# Patient Record
Sex: Female | Born: 1935 | Race: White | Hispanic: No | Marital: Single | Smoking: Never smoker
Health system: Southern US, Community
[De-identification: ages and names within clinical notes are randomized; demographics above are authoritative.]

## PROBLEM LIST (undated history)

## (undated) DIAGNOSIS — E0590 Thyrotoxicosis, unspecified without thyrotoxic crisis or storm: Secondary | ICD-10-CM

## (undated) DIAGNOSIS — F0390 Unspecified dementia without behavioral disturbance: Secondary | ICD-10-CM

## (undated) DIAGNOSIS — K219 Gastro-esophageal reflux disease without esophagitis: Secondary | ICD-10-CM

## (undated) DIAGNOSIS — E079 Disorder of thyroid, unspecified: Secondary | ICD-10-CM

## (undated) HISTORY — PX: ABDOMINAL HYSTERECTOMY: SHX81

---

## 1998-07-07 ENCOUNTER — Other Ambulatory Visit: Admission: RE | Admit: 1998-07-07 | Discharge: 1998-07-07 | Payer: Self-pay | Admitting: Obstetrics and Gynecology

## 1999-07-13 ENCOUNTER — Other Ambulatory Visit: Admission: RE | Admit: 1999-07-13 | Discharge: 1999-07-13 | Payer: Self-pay | Admitting: Obstetrics and Gynecology

## 1999-10-23 ENCOUNTER — Ambulatory Visit (HOSPITAL_COMMUNITY): Admission: RE | Admit: 1999-10-23 | Discharge: 1999-10-23 | Payer: Self-pay

## 1999-10-31 ENCOUNTER — Ambulatory Visit (HOSPITAL_COMMUNITY): Admission: RE | Admit: 1999-10-31 | Discharge: 1999-10-31 | Payer: Self-pay

## 1999-11-07 ENCOUNTER — Ambulatory Visit (HOSPITAL_COMMUNITY): Admission: RE | Admit: 1999-11-07 | Discharge: 1999-11-07 | Payer: Self-pay

## 1999-11-16 ENCOUNTER — Ambulatory Visit (HOSPITAL_COMMUNITY): Admission: RE | Admit: 1999-11-16 | Discharge: 1999-11-16 | Payer: Self-pay

## 2000-02-02 ENCOUNTER — Ambulatory Visit (HOSPITAL_COMMUNITY): Admission: RE | Admit: 2000-02-02 | Discharge: 2000-02-02 | Payer: Self-pay

## 2000-07-18 ENCOUNTER — Other Ambulatory Visit: Admission: RE | Admit: 2000-07-18 | Discharge: 2000-07-18 | Payer: Self-pay | Admitting: Obstetrics and Gynecology

## 2001-07-21 ENCOUNTER — Other Ambulatory Visit: Admission: RE | Admit: 2001-07-21 | Discharge: 2001-07-21 | Payer: Self-pay | Admitting: Obstetrics and Gynecology

## 2005-04-02 ENCOUNTER — Ambulatory Visit: Payer: Self-pay | Admitting: Internal Medicine

## 2005-04-13 ENCOUNTER — Ambulatory Visit: Payer: Self-pay | Admitting: Internal Medicine

## 2005-08-29 ENCOUNTER — Ambulatory Visit: Payer: Self-pay

## 2006-12-14 ENCOUNTER — Emergency Department: Payer: Self-pay | Admitting: Emergency Medicine

## 2008-11-08 ENCOUNTER — Encounter: Payer: Self-pay | Admitting: Family Medicine

## 2008-11-11 ENCOUNTER — Ambulatory Visit: Payer: Self-pay | Admitting: Family Medicine

## 2008-11-11 DIAGNOSIS — R6883 Chills (without fever): Secondary | ICD-10-CM

## 2008-11-11 DIAGNOSIS — K5900 Constipation, unspecified: Secondary | ICD-10-CM | POA: Insufficient documentation

## 2008-11-11 DIAGNOSIS — E039 Hypothyroidism, unspecified: Secondary | ICD-10-CM | POA: Insufficient documentation

## 2008-11-11 DIAGNOSIS — M25559 Pain in unspecified hip: Secondary | ICD-10-CM

## 2008-11-11 LAB — CONVERTED CEMR LAB
HCT: 42.6 % (ref 36.0–46.0)
Hemoglobin: 13.9 g/dL (ref 12.0–15.0)
MCHC: 32.6 g/dL (ref 30.0–36.0)
MCV: 100 fL (ref 78.0–100.0)
Platelets: 272 10*3/uL (ref 150–400)
RBC: 4.26 M/uL (ref 3.87–5.11)
RDW: 14.4 % (ref 11.5–15.5)
TSH: 4.148 microintl units/mL (ref 0.350–4.500)
WBC: 6.9 10*3/uL (ref 4.0–10.5)

## 2008-11-14 ENCOUNTER — Encounter: Payer: Self-pay | Admitting: Family Medicine

## 2008-12-03 ENCOUNTER — Telehealth: Payer: Self-pay | Admitting: Family Medicine

## 2008-12-09 ENCOUNTER — Ambulatory Visit: Payer: Self-pay | Admitting: Family Medicine

## 2008-12-09 DIAGNOSIS — F4321 Adjustment disorder with depressed mood: Secondary | ICD-10-CM

## 2010-04-12 ENCOUNTER — Observation Stay: Payer: Self-pay | Admitting: *Deleted

## 2010-10-03 ENCOUNTER — Encounter: Payer: Self-pay | Admitting: Home Health Services

## 2010-10-20 ENCOUNTER — Encounter: Payer: Self-pay | Admitting: Family Medicine

## 2010-10-20 ENCOUNTER — Ambulatory Visit (INDEPENDENT_AMBULATORY_CARE_PROVIDER_SITE_OTHER): Payer: Medicare Other | Admitting: Family Medicine

## 2010-10-20 DIAGNOSIS — M25559 Pain in unspecified hip: Secondary | ICD-10-CM

## 2010-10-20 DIAGNOSIS — F32A Depression, unspecified: Secondary | ICD-10-CM | POA: Insufficient documentation

## 2010-10-20 DIAGNOSIS — K5900 Constipation, unspecified: Secondary | ICD-10-CM

## 2010-10-20 DIAGNOSIS — F329 Major depressive disorder, single episode, unspecified: Secondary | ICD-10-CM

## 2010-10-20 DIAGNOSIS — E039 Hypothyroidism, unspecified: Secondary | ICD-10-CM

## 2010-10-20 LAB — CBC WITH DIFFERENTIAL/PLATELET
Basophils Relative: 0.8 % (ref 0.0–3.0)
Eosinophils Relative: 3.7 % (ref 0.0–5.0)
Hemoglobin: 13.1 g/dL (ref 12.0–15.0)
Lymphocytes Relative: 27 % (ref 12.0–46.0)
MCHC: 33.8 g/dL (ref 30.0–36.0)
Monocytes Relative: 10 % (ref 3.0–12.0)
Neutro Abs: 4.8 10*3/uL (ref 1.4–7.7)
Neutrophils Relative %: 58.5 % (ref 43.0–77.0)
RBC: 3.86 Mil/uL — ABNORMAL LOW (ref 3.87–5.11)
WBC: 8.1 10*3/uL (ref 4.5–10.5)

## 2010-10-20 LAB — FOLATE: Folate: >24.8 ng/mL (ref >5.9)

## 2010-10-20 LAB — TSH: TSH: 3.64 u[IU]/mL (ref 0.35–5.50)

## 2010-10-20 MED ORDER — CITALOPRAM HYDROBROMIDE 10 MG PO TABS: 10.0000 mg | ORAL_TABLET | Freq: Every day | ORAL | Status: DC

## 2010-10-20 MED ORDER — ESTROGENS CONJUGATED 1.25 MG PO TABS: 1.2500 mg | ORAL_TABLET | Freq: Every day | ORAL | Status: DC

## 2010-10-20 MED ORDER — LEVOTHYROXINE SODIUM 50 MCG PO TABS: 50.0000 ug | ORAL_TABLET | Freq: Every day | ORAL | Status: DC

## 2010-10-20 NOTE — Progress Notes (Signed)
Addended by: Adriana Simas on: 10/20/2010 12:02 PM   Modules accepted: Orders

## 2010-10-20 NOTE — Progress Notes (Signed)
  Subjective:    Patient ID: Kristina Browning, female    DOB: [birth year], 77 y.o.   MRN: [id number]  HPI 77  yo female here to establish care. She is here with her two sons.  Depression- lost her husband 5 years ago.  According to pt and her sons, she had an appropriate grief reaction initially.  She was still working 3 days a week which really helped to cheer her up. In October, she stopped working.  Since then, more tearful and more forget.  No difficulty sleeping or decreased appetite.  No SI or HI.  She lives alone and is completely independent for all ADLs and AIDLs, including balancing her checkbook.  Past few weeks, sons have noticed that she is also making more mistakes balancing her checkbook.   Hypothyroidism- has noticed some increased constipation.  Denies other symptoms of hypo or hyperthyroidism. Has been on Synthroid 50 mcg for years.      Has been on premarin for 40 years.  Feels good taking it and would like to continue.   The PMH, PSH, Social History, Family History, Medications, and allergies have been reviewed in [facility], and have been updated if relevant.   Review of Systems    see HPI General: Denies fever, chills, sweats. No significant weight loss. Eyes: Denies blurring,significant itching ENT: Denies earache, sore throat, and hoarseness.  Cardiovascular: Denies chest pains, palpitations, dyspnea on exertion,  Respiratory: Denies cough, dyspnea at rest,wheeezing Breast: no concerns about lumps GI: Denies nausea, vomiting, diarrhea, , abdominal pain, melena, hematochezia. Endorses constipation. GU: Denies dysuria, hematuria, urinary hesitancy, nocturia, denies STD risk, no concerns about discharge Musculoskeletal: Denies back pain, joint pain Derm: Denies rash, itching Neuro: Denies  paresthesias, frequent falls, frequent headaches Psych: +tearfulness, denies anxiety. Endocrine: Denies cold intolerance, heat intolerance, polydipsia Heme: Denies enlarged lymph  nodes Allergy: No hayfever  Objective:   Physical Exam BP 120/70  Pulse 64  Temp(Src) 98.7 F (37.1 C) (Oral)  Ht 5\' 8"  (1.727 m)  Wt 132 lb 1.9 oz (59.929 kg)  BMI 20.09 kg/m2  General:  Appears younger than stated age.  No distress.   Lungs:  Normal respiratory effort, chest expands symmetrically. Lungs are clear to auscultation, no crackles or wheezes. Heart:  Normal rate and regular rhythm. S1 and S2 normal without gallop, murmur, click, rub or other extra sounds. Abdomen:  Bowel sounds positive,abdomen soft and non-tender without masses, organomegaly or hernias noted. Msk:  Back without TTP.  Pt indicates that when she does have her pain, it is in the paraspinous region of the lumbar spine.  Hips with FROM B without pain.  No erythema or warmth noted in back or hips. Pulses:  R radial normal.   Psych- pleasant, good eye contact, oriented x 3.   Assessment & Plan:

## 2010-10-20 NOTE — Patient Instructions (Signed)
Great to meet you. Please make an appointment to come see me in one month.

## 2010-10-20 NOTE — Assessment & Plan Note (Signed)
Recheck TSH today.  

## 2010-10-21 LAB — VITAMIN D 25 HYDROXY (VIT D DEFICIENCY, FRACTURES): Vit D, 25-Hydroxy: 65 ng/mL (ref 30–89)

## 2010-11-14 ENCOUNTER — Encounter: Payer: Self-pay | Admitting: Family Medicine

## 2010-11-14 ENCOUNTER — Ambulatory Visit: Payer: PRIVATE HEALTH INSURANCE | Admitting: Family Medicine

## 2010-11-14 ENCOUNTER — Ambulatory Visit (INDEPENDENT_AMBULATORY_CARE_PROVIDER_SITE_OTHER): Payer: Medicare Other | Admitting: Family Medicine

## 2010-11-14 VITALS — BP 120/70 | HR 66 | Temp 98.0°F | Ht 68.0 in | Wt 136.8 lb

## 2010-11-14 DIAGNOSIS — F329 Major depressive disorder, single episode, unspecified: Secondary | ICD-10-CM

## 2010-11-14 MED ORDER — CITALOPRAM HYDROBROMIDE 10 MG PO TABS: 10.0000 mg | ORAL_TABLET | Freq: Every day | ORAL | Status: DC

## 2010-11-14 NOTE — Progress Notes (Signed)
  Subjective:    Patient ID: Kristina Browning, female    DOB: [birth year], 77 y.o.   MRN: [id number]  HPI 77  yo female here for follow up memory/depression. She is here with her two sons.  Depression- lost her husband 5 years ago.  According to pt and her sons, she had an appropriate grief reaction initially.  She was still working 3 days a week which really helped to cheer her up.   No longer working. Started Celexa 10 mg daily last month.  Per pt, much less tearful, much happier.  Sons still concerned about her memory although admit the things she forgets do not seem major in nature. For example, she told one son that she didn't do anything yesterday and told the other one that she went to the post office.    No difficulty sleeping or decreased appetite.  No SI or HI.  TSH, CBC, Vit D, B12 were within normal limits.  She lives alone and is completely independent for all ADLs and AIDLs, including balancing her checkbook.    The PMH, PSH, Social History, Family History, Medications, and allergies have been reviewed in [facility], and have been updated if relevant.   Review of Systems    see HPI General: Denies fever, chills, sweats. No significant weight loss. Cardiovascular: Denies chest pains, palpitations, dyspnea on exertion,  Respiratory: Denies cough, dyspnea at rest,wheeezing Psych: denies tearfulness, denies anxiety. Endocrine: Denies cold intolerance, heat intolerance, polydipsia Heme: Denies enlarged lymph nodes   Objective:   Physical Exam BP 120/70  Pulse 66  Temp(Src) 98 F (36.7 C) (Oral)  Ht 5\' 8"  (1.727 m)  Wt 136 lb 12.8 oz (62.052 kg)  BMI 20.80 kg/m2  General:  Appears younger than stated age.  No distress.   Lungs:  Normal respiratory effort, chest expands symmetrically. Lungs are clear to auscultation, no crackles or wheezes. Heart:  Normal rate and regular rhythm. S1 and S2 normal without gallop, murmur, click, rub or other extra sounds. Abdomen:  Bowel  sounds positive,abdomen soft and non-tender without masses, organomegaly or hernias noted. Msk:  Back without TTP.  Pt indicates that when she does have her pain, it is in the paraspinous region of the lumbar spine.  Hips with FROM B without pain.  No erythema or warmth noted in back or hips. Pulses:  R radial normal.   Psych- pleasant, good eye contact, oriented x 3.  Mental Status Exam max value patient value  Orientation to time 5 5  Orientation to place 5 5  Registration 3 3  Attention 5 5  Recall 3 3  Language (name 2 objects) 2 2  Language-repeat 1 1  Language-follow 3 step command 3 3  Language-read and follow directions 1 1  Draw a pentagon  1  Write a sentence  1   Mental Status Exam: 28/28     Assessment & Plan:

## 2010-12-05 ENCOUNTER — Ambulatory Visit: Payer: PRIVATE HEALTH INSURANCE | Admitting: Family Medicine

## 2010-12-13 ENCOUNTER — Telehealth: Payer: Self-pay | Admitting: *Deleted

## 2010-12-13 NOTE — Telephone Encounter (Signed)
I do not think it is due to the citalopram but any side effect is possible with any medication. Let's try to wean off of it.  One tablet every other day and then stop.  Let's reassess her memory after she has stopped taking it.

## 2010-12-13 NOTE — Telephone Encounter (Signed)
Patients son and patient notified as instructed via telephone.  He will call and schedule appt once the taper is complete.

## 2010-12-13 NOTE — Telephone Encounter (Signed)
Son says that since patient started the citalopram her short term memory has gotten worse. She can't remember things that she just did. Son is asking if you think it could be related to the citalopram. If so patient is asking if she could come off of it and see it it improves without it. Please advise.

## 2010-12-22 ENCOUNTER — Telehealth: Payer: Self-pay | Admitting: *Deleted

## 2010-12-22 DIAGNOSIS — F329 Major depressive disorder, single episode, unspecified: Secondary | ICD-10-CM

## 2010-12-22 DIAGNOSIS — F4321 Adjustment disorder with depressed mood: Secondary | ICD-10-CM

## 2010-12-22 NOTE — Telephone Encounter (Signed)
Ok I will also make a referral to a psychiatrist as we may need their input as well if that is ok with her son.

## 2010-12-22 NOTE — Telephone Encounter (Signed)
Pt's son states pt has had problems with short term memory for the last 2 weeks.  They thought this could have been caused by her taking celexa, but she has weaned off and the memory problems are worse, along with worsening depression and sadness.  She doesn't remember spending money, shopping, talking with relatives.  Appt made for next week to discuss.

## 2010-12-25 ENCOUNTER — Encounter: Payer: Self-pay | Admitting: Family Medicine

## 2010-12-25 ENCOUNTER — Ambulatory Visit: Payer: Self-pay | Admitting: Family Medicine

## 2010-12-25 ENCOUNTER — Ambulatory Visit (INDEPENDENT_AMBULATORY_CARE_PROVIDER_SITE_OTHER): Payer: Medicare Other | Admitting: Family Medicine

## 2010-12-25 VITALS — BP 130/70 | HR 70 | Temp 97.6°F | Wt 133.8 lb

## 2010-12-25 DIAGNOSIS — R413 Other amnesia: Secondary | ICD-10-CM

## 2010-12-25 DIAGNOSIS — F3289 Other specified depressive episodes: Secondary | ICD-10-CM

## 2010-12-25 DIAGNOSIS — F329 Major depressive disorder, single episode, unspecified: Secondary | ICD-10-CM

## 2010-12-25 NOTE — Progress Notes (Signed)
  Subjective:    Patient ID: Kristina Browning, female    DOB: 1925-10-06, 77 y.o.   MRN: [id number]  HPI 77  yo female here for follow up memory/depression. She is here with her son, [name].  Very supportive family.  Depression- lost her husband 5 years ago.  According to pt and her sons, she had an appropriate grief reaction initially.  She was still working 3 days a week which really helped to cheer her up.   Started Celexa 10 mg daily a few months ago.  Per pt, much less tearful, much happier.  Sons were still concerned about her short term memory, so we attempted to wean off Celexa.  Per Ms. Killingsworth and her son, mood declined without Celexa and she was still forgetting things.  For example, she may forget something she said earlier in same conversation.  She just restarted Celexa 10 mg daily and already feeling better.    No difficulty sleeping or decreased appetite.  No SI or HI.  Memorial day was 5 year anniversary of her husband's death.  MMSE 28/28 in 11/10/2022.  TSH, CBC, Vit D, B12 were within normal limits.  She lives alone and is completely independent for all ADLs and AIDLs, including balancing her checkbook.    She and her sons are ok with pursing a geriatric memory clinic/psychiatrist, preferably at [facility].  The PMH, PSH, Social History, Family History, Medications, and allergies have been reviewed in [facility], and have been updated if relevant.   Review of Systems    see HPI General: Denies fever, chills, sweats. No significant weight loss. Cardiovascular: Denies chest pains, palpitations, dyspnea on exertion,  Respiratory: Denies cough, dyspnea at rest,wheeezing Psych: denies tearfulness, denies anxiety. Endocrine: Denies cold intolerance, heat intolerance, polydipsia Heme: Denies enlarged lymph nodes   Objective:   Physical Exam BP 130/70  Pulse 70  Temp(Src) 97.6 F (36.4 C) (Oral)  Wt 133 lb 12.8 oz (60.691 kg)  General:  Appears younger  than stated age.  No distress.   Lungs:  Normal respiratory effort, chest expands symmetrically. Lungs are clear to auscultation, no crackles or wheezes. Heart:  Normal rate and regular rhythm. S1 and S2 normal without gallop, murmur, click, rub or other extra sounds. Pulses:  R radial normal.   Psych- pleasant, good eye contact, oriented x 3.   Assessment & Plan:  A/p-  Memory loss- >25 min spent with face to face with patient counseling and coordinating care. Pt is ok with referral to geriatric center.  Appt made.  Depression- improved with Celexa.  Continue 10 mg daily dose.

## 2010-12-25 NOTE — Telephone Encounter (Signed)
Patient coming in today to see Dr. Dayton Martes.  Will discuss at here appt.

## 2011-01-17 ENCOUNTER — Other Ambulatory Visit: Payer: Self-pay

## 2011-04-26 ENCOUNTER — Ambulatory Visit (INDEPENDENT_AMBULATORY_CARE_PROVIDER_SITE_OTHER): Payer: Medicare Other | Admitting: Family Medicine

## 2011-04-26 ENCOUNTER — Encounter: Payer: Self-pay | Admitting: Family Medicine

## 2011-04-26 ENCOUNTER — Telehealth: Payer: Self-pay | Admitting: *Deleted

## 2011-04-26 VITALS — BP 130/62 | HR 72 | Temp 98.7°F | Ht 68.0 in | Wt 131.8 lb

## 2011-04-26 DIAGNOSIS — R413 Other amnesia: Secondary | ICD-10-CM

## 2011-04-26 DIAGNOSIS — R197 Diarrhea, unspecified: Secondary | ICD-10-CM | POA: Insufficient documentation

## 2011-04-26 DIAGNOSIS — E039 Hypothyroidism, unspecified: Secondary | ICD-10-CM

## 2011-04-26 NOTE — Patient Instructions (Signed)
Good to see you. Please call Kristina Browning [facility] to make a follow up appointment as we discussed. It's ok to not restart your Premarin.

## 2011-04-26 NOTE — Telephone Encounter (Signed)
Requested medical records from the release of information department from patients visits with the [facility].

## 2011-04-26 NOTE — Progress Notes (Signed)
  Subjective:    Patient ID: Kristina Browning, female    DOB: [birth year], 77 y.o.   MRN: [id number]  HPI 77  yo female here for follow up memory/depression. She is here with her son, [name].    Depression- lost her husband 5 years ago.  According to pt and her sons, she had an appropriate grief reaction initially.  Started Celexa 10 mg daily , eventually increased to 20 mg daily.  Referred to [facility]- we have requested office notes and were faxed last office visit from 03/13/2011- notes reviewed. Started on Aricept 5 mg daily in August and eventually weaned up to Aricept 10 mg daily.  Her son, [name], has recently taken away her driving privileges due to safety concerns.  Since increasing dose of Aricept, she was having severe bouts of diarrhea.  No blood in stool, no abdominal pain.  Stopped aricept and symptoms have completely resolved.  Weight relatively stable, has lost 5 pounds since April. Wt Readings from Last 3 Encounters:  04/26/11 131 lb 12 oz (59.761 kg)  12/25/10 133 lb 12.8 oz (60.691 kg)  11/14/10 136 lb 12.8 oz (62.052 kg)    The PMH, PSH, Social History, Family History, Medications, and allergies have been reviewed in [facility], and have been updated if relevant.   Review of Systems    see HPI General: Denies fever, chills, sweats. No significant weight loss. Cardiovascular: Denies chest pains, palpitations, dyspnea on exertion,  Respiratory: Denies cough, dyspnea at rest,wheeezing Psych: denies tearfulness, denies anxiety. Endocrine: Denies cold intolerance, heat intolerance, polydipsia Heme: Denies enlarged lymph nodes   Objective:   Physical Exam BP 130/62  Pulse 72  Temp(Src) 98.7 F (37.1 C) (Oral)  Ht 5\' 8"  (1.727 m)  Wt 131 lb 12 oz (59.761 kg)  BMI 20.03 kg/m2  General:  Appears younger than stated age.  No distress.   Lungs:  Normal respiratory effort, chest expands symmetrically. Lungs are clear to auscultation, no crackles or  wheezes. Heart:  Normal rate and regular rhythm. S1 and S2 normal without gallop, murmur, click, rub or other extra sounds. Pulses:  R radial normal.   Psych- pleasant, good eye contact, oriented x 3.   Assessment & Plan:   1. Memory loss   Mild but per son's report, severe decline over last several weeks even before stopping the aricept. >25 min spent with face to face with patient, >50% counseling and/or coordinating care. I asked both pt and her son who they would like to manage dementia and depression.  They prefer to continue with [facility] clinic and will call to set up follow appt.    2. Diarrhea   Resolved, likely secondary to higher dose of aricept. No further tx necessary.

## 2011-04-27 LAB — T4, FREE: Free T4: 0.73 ng/dL (ref 0.60–1.60)

## 2011-05-15 ENCOUNTER — Ambulatory Visit: Payer: PRIVATE HEALTH INSURANCE | Admitting: Family Medicine

## 2011-07-19 ENCOUNTER — Other Ambulatory Visit: Payer: Self-pay | Admitting: *Deleted

## 2011-07-19 MED ORDER — LEVOTHYROXINE SODIUM 50 MCG PO TABS: 50.0000 ug | ORAL_TABLET | Freq: Every day | ORAL | Status: DC

## 2011-07-27 ENCOUNTER — Ambulatory Visit (INDEPENDENT_AMBULATORY_CARE_PROVIDER_SITE_OTHER): Payer: Medicare Other | Admitting: Family Medicine

## 2011-07-27 ENCOUNTER — Other Ambulatory Visit: Payer: Self-pay | Admitting: Family Medicine

## 2011-07-27 ENCOUNTER — Ambulatory Visit (INDEPENDENT_AMBULATORY_CARE_PROVIDER_SITE_OTHER)
Admission: RE | Admit: 2011-07-27 | Discharge: 2011-07-27 | Disposition: A | Discharge status: Home / Self Care | Payer: Medicare Other | Source: Ambulatory Visit | Attending: Family Medicine | Admitting: Family Medicine

## 2011-07-27 ENCOUNTER — Encounter: Payer: Self-pay | Admitting: Family Medicine

## 2011-07-27 VITALS — BP 112/70 | HR 87 | Temp 98.4°F | Wt 140.2 lb

## 2011-07-27 DIAGNOSIS — M25519 Pain in unspecified shoulder: Secondary | ICD-10-CM

## 2011-07-27 DIAGNOSIS — E039 Hypothyroidism, unspecified: Secondary | ICD-10-CM

## 2011-07-27 DIAGNOSIS — M25511 Pain in right shoulder: Secondary | ICD-10-CM

## 2011-07-27 DIAGNOSIS — R9389 Abnormal findings on diagnostic imaging of other specified body structures: Secondary | ICD-10-CM | POA: Insufficient documentation

## 2011-07-27 DIAGNOSIS — R413 Other amnesia: Secondary | ICD-10-CM

## 2011-07-27 NOTE — Progress Notes (Signed)
  Subjective:    Patient ID: Kristina Browning, female    DOB: 07/16/1926, 78 y.o.   MRN: [id number]  HPI 78  yo female here for follow up. She is here with her sons.   Depression- sons feel like her symptoms have resolved and pt agrees. Would like to stop taking the citalopram.  Memory is worse.    Referred to [facility]- tried aricept but she could not tolerate it due to GI side effects.   Sons worried that her memory is deteriorating and not safe at home.    Weight relatively stable, has lost 5 pounds since April. Wt Readings from Last 3 Encounters:  04/26/11 131 lb 12 oz (59.761 kg)  12/25/10 133 lb 12.8 oz (60.691 kg)  11/14/10 136 lb 12.8 oz (62.052 kg)   Hypothyroidism- on synthroid 50 mcg daily. Lab Results  Component Value Date   TSH 3.16 04/26/2011  Denies any symptoms of hypo or hyperthyroidism.  Right shoulder pain- fell on right shoulder several months ago.  Still sore.  Sons concerned that she is minimizing pain.    The PMH, PSH, Social History, Family History, Medications, and allergies have been reviewed in [facility], and have been updated if relevant.   Review of Systems    see HPI General: Denies fever, chills, sweats. No significant weight loss. Cardiovascular: Denies chest pains, palpitations, dyspnea on exertion,  Respiratory: Denies cough, dyspnea at rest,wheeezing Psych: denies tearfulness, denies anxiety. Endocrine: Denies cold intolerance, heat intolerance, polydipsia Heme: Denies enlarged lymph nodes   Objective:   Physical Exam BP 112/70  Pulse 87  Temp(Src) 98.4 F (36.9 C) (Oral)  Wt 140 lb 4 oz (63.617 kg)  General:  Appears younger than stated age.  No distress.   Lungs:  Normal respiratory effort, chest expands symmetrically. Lungs are clear to auscultation, no crackles or wheezes. Heart:  Normal rate and regular rhythm. S1 and S2 normal without gallop, murmur, click, rub or other extra sounds. Pulses:  R radial normal.    MSK:   FROM of right shoulder, neg empty can, neg arch Psych- pleasant, good eye contact, oriented x 3.   Assessment & Plan:   1. Unspecified hypothyroidism  Unchanged,  Recheck labs today. TSH, T4, free  2. Right shoulder pain  New- benign exam, no signs of rotator cuff injury.  Due to sons concerns, will check xray today. DG Shoulder Right  3. Memory loss  Per sons, continues to deteriorate.  Discussed moving to ALF.  They will look into it.

## 2011-07-27 NOTE — Patient Instructions (Signed)
Good to see you. I will call you with the results of your xray and blood work.  To wean off citalopram: Take 1 tablet every other day for 1 week, then take 1/2 tablet every other day for a week and stop.  Visit places like [facility] and pick up an [facility] for me to fill out. Let me know how I can help.  Have a good weekend.

## 2011-07-28 LAB — TSH: TSH: 7.736 u[IU]/mL — ABNORMAL HIGH (ref 0.350–4.500)

## 2011-07-28 LAB — T4, FREE: Free T4: 0.79 ng/dL — ABNORMAL LOW (ref 0.80–1.80)

## 2011-07-30 ENCOUNTER — Other Ambulatory Visit: Payer: Self-pay | Admitting: *Deleted

## 2011-07-30 MED ORDER — LEVOTHYROXINE SODIUM 75 MCG PO TABS: 75.0000 ug | ORAL_TABLET | Freq: Every day | ORAL | Status: DC

## 2011-07-30 NOTE — Telephone Encounter (Signed)
Pharmacy faxed over refill request stating that patient has been taking brand name Synthroid and requested a new Rx be sent in.  Rx sent to [facility] pharmacy.

## 2011-08-07 ENCOUNTER — Encounter (HOSPITAL_COMMUNITY): Payer: Self-pay | Admitting: *Deleted

## 2011-08-07 ENCOUNTER — Emergency Department (HOSPITAL_COMMUNITY)
Admission: EM | Admit: 2011-08-07 | Discharge: 2011-08-07 | Disposition: A | Discharge status: Home / Self Care | Payer: Medicare Other | Attending: Emergency Medicine | Admitting: Emergency Medicine

## 2011-08-07 ENCOUNTER — Emergency Department (HOSPITAL_COMMUNITY): Payer: Medicare Other

## 2011-08-07 ENCOUNTER — Ambulatory Visit: Payer: Medicare Other | Admitting: Family Medicine

## 2011-08-07 ENCOUNTER — Other Ambulatory Visit: Payer: Medicare Other

## 2011-08-07 DIAGNOSIS — M25519 Pain in unspecified shoulder: Secondary | ICD-10-CM | POA: Insufficient documentation

## 2011-08-07 DIAGNOSIS — W010XXA Fall on same level from slipping, tripping and stumbling without subsequent striking against object, initial encounter: Secondary | ICD-10-CM | POA: Insufficient documentation

## 2011-08-07 DIAGNOSIS — Z79899 Other long term (current) drug therapy: Secondary | ICD-10-CM | POA: Insufficient documentation

## 2011-08-07 DIAGNOSIS — S060X0A Concussion without loss of consciousness, initial encounter: Secondary | ICD-10-CM | POA: Insufficient documentation

## 2011-08-07 DIAGNOSIS — R51 Headache: Secondary | ICD-10-CM | POA: Insufficient documentation

## 2011-08-07 DIAGNOSIS — F068 Other specified mental disorders due to known physiological condition: Secondary | ICD-10-CM | POA: Insufficient documentation

## 2011-08-07 DIAGNOSIS — S0003XA Contusion of scalp, initial encounter: Secondary | ICD-10-CM | POA: Insufficient documentation

## 2011-08-07 HISTORY — DX: Unspecified dementia, unspecified severity, without behavioral disturbance, psychotic disturbance, mood disturbance, and anxiety: F03.90

## 2011-08-07 HISTORY — DX: Disorder of thyroid, unspecified: E07.9

## 2011-08-07 NOTE — ED Notes (Signed)
Discharge instructions reviewed with pt; verbalizes understanding.  No questions asked; no further c/o's voiced.  Pt ambulatory to lobby with son.

## 2011-08-07 NOTE — ED Provider Notes (Signed)
History     CSN: [id number]  Arrival date & time 08/07/11  9147   First MD Initiated Contact with Patient 08/07/11 1104      Chief Complaint  Patient presents with  . Fall     Patient is a 78 y.o. female presenting with fall. The history is provided by the patient.  Fall The accident occurred 1 to 2 hours ago. She fell from a height of 3 to 5 ft. The point of impact was the head and right shoulder. The pain is present in the head and right shoulder. The pain is mild. She was ambulatory at the scene. Pertinent negatives include no visual change, no abdominal pain, no nausea, no vomiting, no headaches and no loss of consciousness. The symptoms are aggravated by activity.  Pt presents s/p fall She reports she slipped/fell in the bathroom No LOC Reports right shoulder pain No back/chest/neck pain reported No abd pain She is not on anticoagulants She lives at home but with family support Has had some chronic right shoulder pain that is not new  Past Medical History  Diagnosis Date  . Dementia   . Thyroid disease     History reviewed. No pertinent past surgical history.  No family history on file.  History  Substance Use Topics  . Smoking status: Never Smoker   . Smokeless tobacco: Not on file  . Alcohol Use: No    OB History    Grav Para Term Preterm Abortions TAB SAB Ect Mult Living                  Review of Systems  Gastrointestinal: Negative for nausea, vomiting and abdominal pain.  Neurological: Negative for loss of consciousness and headaches.    Allergies  Sulfonamide derivatives  Home Medications   Current Outpatient Rx  Name Route Sig Dispense Refill  . CITALOPRAM HYDROBROMIDE 10 MG PO TABS Oral Take 5 mg by mouth every other day.    Marland Kitchen LEVOTHYROXINE SODIUM 75 MCG PO TABS Oral Take 1 tablet (75 mcg total) by mouth daily. 30 tablet 1    Dispense as written.  . CENTRUM SILVER PO Oral Take 1 tablet by mouth daily.        BP 125/62  Pulse 75  Temp  98.3 F (36.8 C)  Resp 20  SpO2 99%  Physical Exam  CONSTITUTIONAL: Well developed/well nourished HEAD AND FACE: small hematoma noted to occiput No laceration noted No stepoffs noted EYES: EOMI/PERRL ENMT: Mucous membranes moist No evidence of facial/nasal trauma NECK: supple no meningeal signs SPINE:entire spine nontender No bruising/crepitance/stepoffs noted to spine CV: S1/S2 noted, no murmurs/rubs/gallops noted LUNGS: Lungs are clear to auscultation bilaterally, no apparent distress ABDOMEN: soft, nontender, no rebound or guarding GU:no cva tenderness NEURO: Pt is awake/alert, moves all extremitiesx4 Normal gait noted EXTREMITIES: pulses normal, full ROM Tender to right anterior shoulder but no bruising/crepitance/stepoffs.  She is able to range the shoulder and touch opposite shoulder with right hand All other extremities/joints palpated/ranged and nontender No bony spine tenderness, no bruising to buttocks (chaperone present) SKIN: warm, color normal PSYCH: no abnormalities of mood noted   ED Course  Procedures  12:30 PM She is safe for d/c home Good family support No other signs of traumatic injury     MDM  Nursing notes reviewed and considered in documentation         Joya Gaskins, MD 08/07/11 1230

## 2011-08-07 NOTE — ED Notes (Signed)
Pt has dementia and lives alone and fell this am in the bathroom. Pt hit the back of the tub with the back of her head and has hematoma there.

## 2011-08-22 ENCOUNTER — Other Ambulatory Visit: Payer: Medicare Other

## 2011-08-30 ENCOUNTER — Ambulatory Visit (INDEPENDENT_AMBULATORY_CARE_PROVIDER_SITE_OTHER)
Admission: RE | Admit: 2011-08-30 | Discharge: 2011-08-30 | Disposition: A | Discharge status: Home / Self Care | Payer: Medicare Other | Source: Ambulatory Visit | Attending: Family Medicine | Admitting: Family Medicine

## 2011-08-30 ENCOUNTER — Other Ambulatory Visit: Payer: Self-pay | Admitting: Family Medicine

## 2011-08-30 DIAGNOSIS — R911 Solitary pulmonary nodule: Secondary | ICD-10-CM

## 2011-08-30 DIAGNOSIS — R9389 Abnormal findings on diagnostic imaging of other specified body structures: Secondary | ICD-10-CM

## 2011-09-27 ENCOUNTER — Other Ambulatory Visit: Payer: Medicare Other

## 2011-09-28 ENCOUNTER — Other Ambulatory Visit: Payer: Self-pay

## 2011-09-28 MED ORDER — LEVOTHYROXINE SODIUM 75 MCG PO TABS: 75.0000 ug | ORAL_TABLET | Freq: Every day | ORAL | Status: DC

## 2011-09-28 NOTE — Telephone Encounter (Signed)
pts son came by office to ck on refill of Synthroid 75 mcg. Pts lab appt was cancelled on 09/27/11.Pts son said his brother had to go out of town and he guesses that is why appt cancelled. He will speak with his brother and call back to reschedule lab appt. Dr Dayton Martes said OK to refill Synthroid 75 mcg #30 until lab repeated. Medication phoned to [facility] pharmacy as instructed.

## 2011-10-25 ENCOUNTER — Other Ambulatory Visit: Payer: Self-pay | Admitting: *Deleted

## 2011-10-25 MED ORDER — LEVOTHYROXINE SODIUM 75 MCG PO TABS: 75.0000 ug | ORAL_TABLET | Freq: Every day | ORAL | Status: DC

## 2011-10-31 ENCOUNTER — Other Ambulatory Visit: Payer: Self-pay

## 2011-10-31 NOTE — Telephone Encounter (Signed)
[facility] did not receive the phone in order on 10/25/11 for DAW Synthroid 75 mcg. Spoke with pharmacist at [facility] and gave verbal for # 30 x 11.

## 2012-02-20 ENCOUNTER — Telehealth: Payer: Self-pay

## 2012-02-20 NOTE — Telephone Encounter (Signed)
Please call to check on pt. 

## 2012-02-20 NOTE — Telephone Encounter (Signed)
Kristina Browning with [facility] speaking with pts son who is enroute to pts home; pt feels light headed,dizzy; did not pass out. No other info until son gets to pts home but should pt go to ER for eval? Dr Dayton Martes said yes. Kristina Browning will advise son.

## 2012-02-20 NOTE — Telephone Encounter (Signed)
Called son's number, no answer, left message on voice mail asking him to call with update.

## 2012-02-20 NOTE — Telephone Encounter (Signed)
Caller: Chris/Child; PCP: Ruthe Mannan (Nestor Ramp); CB#: [phone]; ; ; Call regarding Lightheaded;   Son/Chris states patient developed lightheadedness 02/20/12. States patient was "perspiring." Onset 02/20/12 1030. States patient "almost fell." Denies syncope or loss of consciousness. Denies chest pain or dyspnea. Son is not currently with patient and has limited information for triage assessment. Call placed to Patient @ [phone]. No answer.  Son states he is in route to patient's residence. States that Patient's Sister is at home with Patient.  Triage per Dizziness or Vertigo Protocol. No appts. available in Epic EHR. RN spoke with Artelia Laroche RN in office. Orders received: Per Dr. Dayton Martes, patient to be seen in ED. Son/Chris advised of above. Advised to assist patient to avoid falling, change positions slowly. Advised to call 911 if neurological sx, chest pain or shortness of breath. Son verbalizes understanding and agreeable.

## 2012-02-27 ENCOUNTER — Other Ambulatory Visit: Payer: Medicare Other

## 2012-04-22 HISTORY — PX: TOTAL HIP ARTHROPLASTY: SHX124

## 2012-04-24 ENCOUNTER — Encounter: Payer: No Typology Code available for payment source | Admitting: Family Medicine

## 2012-04-30 ENCOUNTER — Inpatient Hospital Stay: Admit: 2012-04-30 | Payer: Self-pay | Admitting: Orthopedic Surgery

## 2012-04-30 ENCOUNTER — Encounter (HOSPITAL_COMMUNITY)
Admission: EM | Disposition: A | Discharge status: Skilled Nursing Facility | Payer: Self-pay | Source: Home / Self Care | Attending: Internal Medicine

## 2012-04-30 ENCOUNTER — Encounter (HOSPITAL_COMMUNITY): Payer: Self-pay | Admitting: Emergency Medicine

## 2012-04-30 ENCOUNTER — Inpatient Hospital Stay (HOSPITAL_COMMUNITY): Payer: Medicare Other | Admitting: Anesthesiology

## 2012-04-30 ENCOUNTER — Encounter (HOSPITAL_COMMUNITY): Payer: Self-pay | Admitting: Anesthesiology

## 2012-04-30 ENCOUNTER — Telehealth: Payer: Self-pay

## 2012-04-30 ENCOUNTER — Inpatient Hospital Stay (HOSPITAL_COMMUNITY)
Admission: EM | Admit: 2012-04-30 | Discharge: 2012-05-05 | DRG: 470 | Disposition: A | Discharge status: Skilled Nursing Facility | Payer: Medicare Other | Attending: Internal Medicine | Admitting: Internal Medicine

## 2012-04-30 ENCOUNTER — Emergency Department (HOSPITAL_COMMUNITY): Payer: Medicare Other

## 2012-04-30 DIAGNOSIS — Z79899 Other long term (current) drug therapy: Secondary | ICD-10-CM

## 2012-04-30 DIAGNOSIS — S7292XA Unspecified fracture of left femur, initial encounter for closed fracture: Secondary | ICD-10-CM | POA: Diagnosis present

## 2012-04-30 DIAGNOSIS — R197 Diarrhea, unspecified: Secondary | ICD-10-CM

## 2012-04-30 DIAGNOSIS — Z880 Allergy status to penicillin: Secondary | ICD-10-CM

## 2012-04-30 DIAGNOSIS — D62 Acute posthemorrhagic anemia: Secondary | ICD-10-CM | POA: Diagnosis not present

## 2012-04-30 DIAGNOSIS — S72009A Fracture of unspecified part of neck of unspecified femur, initial encounter for closed fracture: Secondary | ICD-10-CM

## 2012-04-30 DIAGNOSIS — R9431 Abnormal electrocardiogram [ECG] [EKG]: Secondary | ICD-10-CM

## 2012-04-30 DIAGNOSIS — K5900 Constipation, unspecified: Secondary | ICD-10-CM

## 2012-04-30 DIAGNOSIS — W07XXXA Fall from chair, initial encounter: Secondary | ICD-10-CM | POA: Diagnosis present

## 2012-04-30 DIAGNOSIS — Z7901 Long term (current) use of anticoagulants: Secondary | ICD-10-CM

## 2012-04-30 DIAGNOSIS — R9389 Abnormal findings on diagnostic imaging of other specified body structures: Secondary | ICD-10-CM

## 2012-04-30 DIAGNOSIS — Y92009 Unspecified place in unspecified non-institutional (private) residence as the place of occurrence of the external cause: Secondary | ICD-10-CM

## 2012-04-30 DIAGNOSIS — D72829 Elevated white blood cell count, unspecified: Secondary | ICD-10-CM

## 2012-04-30 DIAGNOSIS — M25559 Pain in unspecified hip: Secondary | ICD-10-CM | POA: Diagnosis present

## 2012-04-30 DIAGNOSIS — R413 Other amnesia: Secondary | ICD-10-CM | POA: Diagnosis present

## 2012-04-30 DIAGNOSIS — W19XXXA Unspecified fall, initial encounter: Secondary | ICD-10-CM

## 2012-04-30 DIAGNOSIS — F0390 Unspecified dementia without behavioral disturbance: Secondary | ICD-10-CM | POA: Diagnosis present

## 2012-04-30 DIAGNOSIS — E039 Hypothyroidism, unspecified: Secondary | ICD-10-CM | POA: Diagnosis present

## 2012-04-30 DIAGNOSIS — S72033A Displaced midcervical fracture of unspecified femur, initial encounter for closed fracture: Principal | ICD-10-CM | POA: Diagnosis present

## 2012-04-30 DIAGNOSIS — M25511 Pain in right shoulder: Secondary | ICD-10-CM

## 2012-04-30 DIAGNOSIS — Z882 Allergy status to sulfonamides status: Secondary | ICD-10-CM

## 2012-04-30 DIAGNOSIS — F329 Major depressive disorder, single episode, unspecified: Secondary | ICD-10-CM

## 2012-04-30 DIAGNOSIS — S72002A Fracture of unspecified part of neck of left femur, initial encounter for closed fracture: Secondary | ICD-10-CM

## 2012-04-30 DIAGNOSIS — N390 Urinary tract infection, site not specified: Secondary | ICD-10-CM | POA: Diagnosis present

## 2012-04-30 DIAGNOSIS — F4321 Adjustment disorder with depressed mood: Secondary | ICD-10-CM

## 2012-04-30 DIAGNOSIS — Z01810 Encounter for preprocedural cardiovascular examination: Secondary | ICD-10-CM

## 2012-04-30 HISTORY — DX: Thyrotoxicosis, unspecified without thyrotoxic crisis or storm: E05.90

## 2012-04-30 HISTORY — PX: HIP ARTHROPLASTY: SHX981

## 2012-04-30 LAB — BASIC METABOLIC PANEL
Calcium: 9.5 mg/dL (ref 8.4–10.5)
GFR calc Af Amer: 58 mL/min — ABNORMAL LOW (ref >90)
GFR calc non Af Amer: 50 mL/min — ABNORMAL LOW (ref >90)
Potassium: 3.8 mEq/L (ref 3.5–5.1)
Sodium: 138 mEq/L (ref 135–145)

## 2012-04-30 LAB — CBC WITH DIFFERENTIAL/PLATELET
Basophils Absolute: 0.1 10*3/uL (ref 0.0–0.1)
Basophils Relative: 1 % (ref 0–1)
Eosinophils Absolute: 0.1 10*3/uL (ref 0.0–0.7)
Eosinophils Relative: 1 % (ref 0–5)
Lymphs Abs: 1.7 10*3/uL (ref 0.7–4.0)
MCH: 32.5 pg (ref 26.0–34.0)
MCHC: 34.2 g/dL (ref 30.0–36.0)
Neutrophils Relative %: 80 % — ABNORMAL HIGH (ref 43–77)
Platelets: 244 10*3/uL (ref 150–400)
RBC: 4.77 MIL/uL (ref 3.87–5.11)
RDW: 14.2 % (ref 11.5–15.5)

## 2012-04-30 LAB — URINE MICROSCOPIC-ADD ON

## 2012-04-30 LAB — URINALYSIS, ROUTINE W REFLEX MICROSCOPIC
Nitrite: NEGATIVE
Protein, ur: NEGATIVE mg/dL
Specific Gravity, Urine: 1.014 (ref 1.005–1.030)
Urobilinogen, UA: 1 mg/dL (ref 0.0–1.0)

## 2012-04-30 LAB — TROPONIN I: Troponin I: <0.3 ng/mL (ref <0.30)

## 2012-04-30 SURGERY — HEMIARTHROPLASTY, HIP, DIRECT ANTERIOR APPROACH, FOR FRACTURE
Anesthesia: General | Site: Hip | Laterality: Left | Wound class: Clean

## 2012-04-30 MED ORDER — ROCURONIUM BROMIDE 100 MG/10ML IV SOLN
INTRAVENOUS | Status: DC | PRN
  Administered 2012-04-30: 35 mg via INTRAVENOUS

## 2012-04-30 MED ORDER — SODIUM CHLORIDE 0.9 % IV SOLN
INTRAVENOUS | Status: DC
  Administered 2012-05-01 – 2012-05-04 (×3): via INTRAVENOUS

## 2012-04-30 MED ORDER — BISACODYL 10 MG RE SUPP: 10.0000 mg | Freq: Every day | RECTAL | Status: DC | PRN

## 2012-04-30 MED ORDER — FENTANYL CITRATE 0.05 MG/ML IJ SOLN
INTRAMUSCULAR | Status: DC | PRN
  Administered 2012-04-30: 75 ug via INTRAVENOUS
  Administered 2012-04-30 (×2): 50 ug via INTRAVENOUS
  Administered 2012-04-30: 75 ug via INTRAVENOUS

## 2012-04-30 MED ORDER — NEOSTIGMINE METHYLSULFATE 1 MG/ML IJ SOLN
INTRAMUSCULAR | Status: DC | PRN
  Administered 2012-04-30: 4 mg via INTRAVENOUS

## 2012-04-30 MED ORDER — ONDANSETRON HCL 4 MG/2ML IJ SOLN
INTRAMUSCULAR | Status: DC | PRN
  Administered 2012-04-30: 4 mg via INTRAVENOUS

## 2012-04-30 MED ORDER — LATANOPROST 0.005 % OP SOLN
1.0000 [drp] | Freq: Every day | OPHTHALMIC | Status: DC
  Administered 2012-05-01 – 2012-05-04 (×4): 1 [drp] via OPHTHALMIC
  Filled 2012-04-30: qty 2.5

## 2012-04-30 MED ORDER — FENTANYL CITRATE 0.05 MG/ML IJ SOLN
100.0000 ug | Freq: Once | INTRAMUSCULAR | Status: AC
  Administered 2012-04-30: 100 ug via INTRAVENOUS
  Filled 2012-04-30: qty 2

## 2012-04-30 MED ORDER — SODIUM CHLORIDE 0.9 % IV SOLN
INTRAVENOUS | Status: DC
  Administered 2012-04-30: 13:00:00 via INTRAVENOUS

## 2012-04-30 MED ORDER — BUPIVACAINE-EPINEPHRINE 0.5% -1:200000 IJ SOLN
INTRAMUSCULAR | Status: DC | PRN
  Administered 2012-04-30: 15 mL

## 2012-04-30 MED ORDER — HYDROMORPHONE HCL PF 1 MG/ML IJ SOLN: 0.2500 mg | INTRAMUSCULAR | Status: DC | PRN

## 2012-04-30 MED ORDER — MORPHINE SULFATE 2 MG/ML IJ SOLN
INTRAMUSCULAR | Status: AC
  Filled 2012-04-30: qty 1

## 2012-04-30 MED ORDER — CEFAZOLIN SODIUM-DEXTROSE 2-3 GM-% IV SOLR
2.0000 g | INTRAVENOUS | Status: AC
  Administered 2012-04-30: 2 g via INTRAVENOUS
  Filled 2012-04-30: qty 50

## 2012-04-30 MED ORDER — ONDANSETRON HCL 4 MG/2ML IJ SOLN
4.0000 mg | Freq: Once | INTRAMUSCULAR | Status: AC
  Administered 2012-04-30: 4 mg via INTRAVENOUS
  Filled 2012-04-30: qty 2

## 2012-04-30 MED ORDER — SENNOSIDES-DOCUSATE SODIUM 8.6-50 MG PO TABS: 1.0000 | ORAL_TABLET | Freq: Every evening | ORAL | Status: DC | PRN

## 2012-04-30 MED ORDER — MORPHINE SULFATE 2 MG/ML IJ SOLN
0.5000 mg | INTRAMUSCULAR | Status: DC | PRN
  Administered 2012-04-30 (×3): 0.5 mg via INTRAVENOUS
  Filled 2012-04-30 (×2): qty 1

## 2012-04-30 MED ORDER — GLYCOPYRROLATE 0.2 MG/ML IJ SOLN
INTRAMUSCULAR | Status: DC | PRN
  Administered 2012-04-30: .8 mg via INTRAVENOUS

## 2012-04-30 MED ORDER — PROPOFOL 10 MG/ML IV BOLUS
INTRAVENOUS | Status: DC | PRN
  Administered 2012-04-30: 120 mg via INTRAVENOUS

## 2012-04-30 MED ORDER — ONDANSETRON HCL 4 MG/2ML IJ SOLN: 4.0000 mg | Freq: Once | INTRAMUSCULAR | Status: AC | PRN

## 2012-04-30 MED ORDER — HYDROCODONE-ACETAMINOPHEN 5-325 MG PO TABS
1.0000 | ORAL_TABLET | Freq: Four times a day (QID) | ORAL | Status: DC | PRN
  Administered 2012-05-01: 2 via ORAL
  Administered 2012-05-01 – 2012-05-02 (×3): 1 via ORAL
  Administered 2012-05-02: 2 via ORAL
  Administered 2012-05-03: 1 via ORAL
  Administered 2012-05-04 – 2012-05-05 (×3): 2 via ORAL
  Filled 2012-04-30 (×2): qty 2
  Filled 2012-04-30 (×3): qty 1
  Filled 2012-04-30 (×2): qty 2
  Filled 2012-04-30: qty 1
  Filled 2012-04-30: qty 2

## 2012-04-30 MED ORDER — LORAZEPAM 0.5 MG PO TABS
0.2500 mg | ORAL_TABLET | Freq: Two times a day (BID) | ORAL | Status: DC | PRN
  Administered 2012-05-01: 0.25 mg via ORAL
  Filled 2012-04-30: qty 1

## 2012-04-30 MED ORDER — LEVOTHYROXINE SODIUM 75 MCG PO TABS
75.0000 ug | ORAL_TABLET | Freq: Every day | ORAL | Status: DC
  Administered 2012-05-01 – 2012-05-05 (×5): 75 ug via ORAL
  Filled 2012-04-30 (×5): qty 1

## 2012-04-30 MED ORDER — LIDOCAINE HCL (CARDIAC) 20 MG/ML IV SOLN
INTRAVENOUS | Status: DC | PRN
  Administered 2012-04-30: 20 mg via INTRAVENOUS

## 2012-04-30 MED ORDER — LACTATED RINGERS IV SOLN
INTRAVENOUS | Status: DC | PRN
  Administered 2012-04-30 (×2): via INTRAVENOUS

## 2012-04-30 MED ORDER — METOPROLOL TARTRATE 12.5 MG HALF TABLET
12.5000 mg | ORAL_TABLET | Freq: Two times a day (BID) | ORAL | Status: DC
  Administered 2012-05-01: 12.5 mg via ORAL
  Filled 2012-04-30 (×3): qty 1

## 2012-04-30 MED ORDER — ACETAMINOPHEN 10 MG/ML IV SOLN: 1000.0000 mg | Freq: Once | INTRAVENOUS | Status: AC | PRN

## 2012-04-30 SURGICAL SUPPLY — 57 items
BLADE SAW SAG 73X25 THK (BLADE) ×1
BLADE SAW SGTL 73X25 THK (BLADE) ×1 IMPLANT
BRUSH FEMORAL CANAL (MISCELLANEOUS) IMPLANT
CEMENT BONE DEPUY (Cement) ×4 IMPLANT
CEMENT RESTRICTOR ×2 IMPLANT
CLOTH BEACON ORANGE TIMEOUT ST (SAFETY) ×2 IMPLANT
COVER BACK TABLE 24X17X13 BIG (DRAPES) IMPLANT
DRAPE ORTHO SPLIT 77X108 STRL (DRAPES) ×4
DRAPE PROXIMA HALF (DRAPES) ×2 IMPLANT
DRAPE SURG ORHT 6 SPLT 77X108 (DRAPES) ×2 IMPLANT
DRAPE U-SHAPE 47X51 STRL (DRAPES) ×2 IMPLANT
DRILL BIT 7/64X5 (BIT) ×4 IMPLANT
DRSG MEPILEX BORDER 4X8 (GAUZE/BANDAGES/DRESSINGS) ×2 IMPLANT
DRSG PAD ABDOMINAL 8X10 ST (GAUZE/BANDAGES/DRESSINGS) ×2 IMPLANT
DURAPREP 26ML APPLICATOR (WOUND CARE) ×2 IMPLANT
ELECT BLADE 6.5 EXT (BLADE) IMPLANT
ELECT CAUTERY BLADE 6.4 (BLADE) ×2 IMPLANT
ELECT REM PT RETURN 9FT ADLT (ELECTROSURGICAL) ×2
ELECTRODE REM PT RTRN 9FT ADLT (ELECTROSURGICAL) ×1 IMPLANT
EVACUATOR 1/8 PVC DRAIN (DRAIN) IMPLANT
FACESHIELD LNG OPTICON STERILE (SAFETY) ×2 IMPLANT
GAUZE XEROFORM 5X9 LF (GAUZE/BANDAGES/DRESSINGS) ×2 IMPLANT
GLOVE BIOGEL PI IND STRL 8 (GLOVE) ×2 IMPLANT
GLOVE BIOGEL PI INDICATOR 8 (GLOVE) ×2
GLOVE ECLIPSE 7.5 STRL STRAW (GLOVE) ×4 IMPLANT
GOWN PREVENTION PLUS XLARGE (GOWN DISPOSABLE) ×4 IMPLANT
GOWN SRG XL XLNG 56XLVL 4 (GOWN DISPOSABLE) ×1 IMPLANT
GOWN STRL NON-REIN LRG LVL3 (GOWN DISPOSABLE) ×2 IMPLANT
GOWN STRL NON-REIN XL XLG LVL4 (GOWN DISPOSABLE) ×2
HANDPIECE INTERPULSE COAX TIP (DISPOSABLE)
IMMOBILIZER KNEE 20 (SOFTGOODS)
IMMOBILIZER KNEE 20 THIGH 36 (SOFTGOODS) IMPLANT
KIT BASIN OR (CUSTOM PROCEDURE TRAY) ×2 IMPLANT
KIT ROOM TURNOVER OR (KITS) ×2 IMPLANT
MANIFOLD NEPTUNE II (INSTRUMENTS) ×2 IMPLANT
NEEDLE 1/2 CIR MAYO (NEEDLE) IMPLANT
NS IRRIG 1000ML POUR BTL (IV SOLUTION) ×2 IMPLANT
PACK TOTAL JOINT (CUSTOM PROCEDURE TRAY) ×2 IMPLANT
PAD ARMBOARD 7.5X6 YLW CONV (MISCELLANEOUS) ×4 IMPLANT
PASSER SUT SWANSON 36MM LOOP (INSTRUMENTS) ×2 IMPLANT
PRESSURIZER FEMORAL UNIV (MISCELLANEOUS) ×2 IMPLANT
SET HNDPC FAN SPRY TIP SCT (DISPOSABLE) IMPLANT
SPONGE GAUZE 4X4 12PLY (GAUZE/BANDAGES/DRESSINGS) ×2 IMPLANT
SUCTION FRAZIER TIP 10 FR DISP (SUCTIONS) IMPLANT
SUT ETHIBOND 2 V 37 (SUTURE) ×4 IMPLANT
SUT PASSER 2.0 195M (MISCELLANEOUS) ×2 IMPLANT
SUT VIC AB 0 CT1 27 (SUTURE) ×6
SUT VIC AB 0 CT1 27XBRD ANBCTR (SUTURE) ×3 IMPLANT
SUT VIC AB 1 CTX 36 (SUTURE) ×8
SUT VIC AB 1 CTX36XBRD ANBCTR (SUTURE) ×4 IMPLANT
SUT VIC AB 2-0 CT1 36 (SUTURE) ×4 IMPLANT
SYR CONTROL 10ML LL (SYRINGE) IMPLANT
TOWEL OR 17X24 6PK STRL BLUE (TOWEL DISPOSABLE) ×2 IMPLANT
TOWEL OR 17X26 10 PK STRL BLUE (TOWEL DISPOSABLE) ×2 IMPLANT
TOWER CARTRIDGE SMART MIX (DISPOSABLE) IMPLANT
TRAY FOLEY CATH 14FR (SET/KITS/TRAYS/PACK) IMPLANT
WATER STERILE IRR 1000ML POUR (IV SOLUTION) ×8 IMPLANT

## 2012-04-30 NOTE — Anesthesia Preprocedure Evaluation (Addendum)
Anesthesia Evaluation  Patient identified by MRN, date of birth, ID band Patient awake    Reviewed: Allergy & Precautions, H&P , NPO status , Patient's Chart, lab work & pertinent test results, reviewed documented beta blocker date and time   Airway Mallampati: II      Dental  (+) Teeth Intact and Dental Advisory Given   Pulmonary  breath sounds clear to auscultation        Cardiovascular Rhythm:Regular Rate:Normal     Neuro/Psych PSYCHIATRIC DISORDERS Depression    GI/Hepatic   Endo/Other  Hypothyroidism   Renal/GU      Musculoskeletal   Abdominal   Peds  Hematology   Anesthesia Other Findings   Reproductive/Obstetrics                          Anesthesia Physical Anesthesia Plan  ASA: III  Anesthesia Plan: General   Post-op Pain Management:    Induction: Intravenous  Airway Management Planned: Oral ETT  Additional Equipment:   Intra-op Plan:   Post-operative Plan: Extubation in OR  Informed Consent:   Dental advisory given  Plan Discussed with: CRNA and Anesthesiologist  Anesthesia Plan Comments: (Dementia Fx L. Hip Non-specific ST T changes on ECG, Nl LV function by echo no history or symptoms of heart disease  Plan GA  Kipp Brood, MD)       Anesthesia Quick Evaluation

## 2012-04-30 NOTE — Progress Notes (Signed)
INITIAL ADULT NUTRITION ASSESSMENT Date: 04/30/2012   Time: 4:23 PM Reason for Assessment: consult; hip fx  ASSESSMENT: Female 78 y.o.  Dx: Femur fracture, left  Hx:  Past Medical History  Diagnosis Date  . Dementia   . Thyroid disease    History reviewed. No pertinent past surgical history.  Related Meds:  Scheduled Meds:   . fentaNYL  100 mcg Intravenous Once  . latanoprost  1 drop Both Eyes QHS  . levothyroxine  75 mcg Oral Daily  . metoprolol tartrate  12.5 mg Oral BID  . morphine      . ondansetron (ZOFRAN) IV  4 mg Intravenous Once   Continuous Infusions:   . sodium chloride    . DISCONTD: sodium chloride 125 mL/hr at 04/30/12 1247   PRN Meds:.bisacodyl, HYDROcodone-acetaminophen, LORazepam, morphine injection, senna-docusate  Ht:  5\' 6"   (estimated height)  Wt:  no wt since admission.  RD unable to obtain accurate wt at assessment.  Previous wt from Jan 2013 was 140 lbs.  Ideal wt:  Unable to assess at this time.  Will follow closely.  Usual Wt:  Wt Readings from Last 10 Encounters:  07/27/11 140 lb 4 oz (63.617 kg)  04/26/11 131 lb 12 oz (59.761 kg)  12/25/10 133 lb 12.8 oz (60.691 kg)  11/14/10 136 lb 12.8 oz (62.052 kg)  10/20/10 132 lb 1.9 oz (59.929 kg)  12/09/08 138 lb 14.4 oz (63.005 kg)  11/11/08 137 lb 4.8 oz (62.279 kg)    % Usual Wt: unable to assess  There is no height or weight on file to calculate BMI.  Food/Nutrition Related Hx: eating breakfast at time of incident  Labs:  CMP     Component Value Date/Time   NA 138 04/30/2012 1315   K 3.8 04/30/2012 1315   CL 104 04/30/2012 1315   CO2 25 04/30/2012 1315   GLUCOSE 108* 04/30/2012 1315   BUN 16 04/30/2012 1315   CREATININE 1.01 04/30/2012 1315   CALCIUM 9.5 04/30/2012 1315   GFRNONAA 50* 04/30/2012 1315   GFRAA 58* 04/30/2012 1315    CBC    Component Value Date/Time   WBC 14.0* 04/30/2012 1315   RBC 4.77 04/30/2012 1315   HGB 15.5* 04/30/2012 1315   HCT 45.3 04/30/2012 1315   PLT  244 04/30/2012 1315   MCV 95.0 04/30/2012 1315   MCH 32.5 04/30/2012 1315   MCHC 34.2 04/30/2012 1315   RDW 14.2 04/30/2012 1315   LYMPHSABS 1.7 04/30/2012 1315   MONOABS 0.9 04/30/2012 1315   EOSABS 0.1 04/30/2012 1315   BASOSABS 0.1 04/30/2012 1315    Intake: NPO Output:  No intake or output data in the 24 hours ending 04/30/12 1627 No stool since admission  Diet Order: NPO  Supplements/Tube Feeding: none at this time  IVF:    sodium chloride   DISCONTD: sodium chloride Last Rate: 125 mL/hr at 04/30/12 1247    Estimated Nutritional Needs:   Kcal: 1590-1780 Protein: 76-89g Fluid: >1.7 L/day  Pt admitted for hip fx s/p fall.  Pt states she was eating well PTA, eating per her usual without changes to appetite and intake.  Pt denies wt change to her knowledge.  Sons at bedside do not report any noticeable change.  NUTRITION DIAGNOSIS: -Increased nutrient needs (NI-5.1).  Status: Ongoing  RELATED TO: healing  AS EVIDENCE BY: pt with hip fx  MONITORING/EVALUATION(Goals): 1.  Food/Beverage; diet advancement once medically appropriate with pt consuming foods per her usual.  EDUCATION NEEDS: -  Education needs addressed  INTERVENTION: 1.  Modify diet; to Regular or per MD once medically appropriate 2.  Supplement; will consider need for supplement once PO intake can be assessed.   DOCUMENTATION CODES Per approved criteria  -Not Applicable   Loyce Dys, MS RD LDN Clinical Inpatient Dietitian Pager: [phone] Weekend/After hours pager: [phone]

## 2012-04-30 NOTE — ED Notes (Signed)
MD at bedside. 

## 2012-04-30 NOTE — Anesthesia Procedure Notes (Signed)
Procedure Name: Intubation Date/Time: 04/30/2012 10:07 PM Performed by: Garen Lah Pre-anesthesia Checklist: Patient identified, Timeout performed, Emergency Drugs available, Suction available and Patient being monitored Patient Re-evaluated:Patient Re-evaluated prior to inductionOxygen Delivery Method: Circle system utilized Preoxygenation: Pre-oxygenation with 100% oxygen Intubation Type: IV induction Ventilation: Mask ventilation without difficulty Laryngoscope Size: Mac and 3 Grade View: Grade III Tube type: Oral Tube size: 7.5 mm Number of attempts: 1 Airway Equipment and Method: Stylet Placement Confirmation: positive ETCO2,  breath sounds checked- equal and bilateral and ETT inserted through vocal cords under direct vision Secured at: 21 cm Tube secured with: Tape Dental Injury: Teeth and Oropharynx as per pre-operative assessment  Difficulty Due To: Difficult Airway- due to dentition, Difficulty was unanticipated and Difficult Airway- due to anterior larynx

## 2012-04-30 NOTE — ED Notes (Signed)
Pt denies LOC, dizziness before fall and no syncopal episode. Pt states she is currently feeling a little nauseous.

## 2012-04-30 NOTE — Brief Op Note (Signed)
04/30/2012  11:34 PM  PATIENT:  Kristina Browning  78 y.o. female  PRE-OPERATIVE DIAGNOSIS:  Left Hip Fracture  POST-OPERATIVE DIAGNOSIS:  left hip fracture  PROCEDURE:  Procedure(s) (LRB) with comments: ARTHROPLASTY BIPOLAR HIP (Left)  SURGEON:  Surgeon(s) and Role:    * Harvie Junior, MD - Primary  PHYSICIAN ASSISTANT:   ASSISTANTS: bethune    ANESTHESIA:   general  EBL:  Total I/O In: 1000 [I.V.:1000] Out: -   BLOOD ADMINISTERED:none  DRAINS: none   LOCAL MEDICATIONS USED:  MARCAINE     SPECIMEN:  No Specimen  DISPOSITION OF SPECIMEN:  N/A  COUNTS:  YES  TOURNIQUET:  * No tourniquets in log *  DICTATION: .Other Dictation: Dictation Number [id number]  PLAN OF CARE: Admit to inpatient   PATIENT DISPOSITION:  PACU - hemodynamically stable.   Delay start of Pharmacological VTE agent (>24hrs) due to surgical blood loss or risk of bleeding: no

## 2012-04-30 NOTE — ED Notes (Addendum)
Moved pt off bed pan and pt is now in pain and requested more pain medication. Giving pt remaining of fentanyl from earlier.

## 2012-04-30 NOTE — ED Provider Notes (Signed)
History     CSN: [id number]  Arrival date & time 04/30/12  1133   First MD Initiated Contact with Patient 04/30/12 1212      Chief Complaint  Patient presents with  . Fall    (Consider location/radiation/quality/duration/timing/severity/associated sxs/prior treatment) HPI Comments: Kristina Browning is a 78 y.o. Female who was sitting on a bar stool, when she fell. This occurred as she was getting up to walk. There were no preceding symptoms. She was unable to get up and walk, secondary to pain. A family member called EMS. Her sons are in the ED, and states that she is at her baseline , mental status. The patient has early Alzheimer's by report. There were no other injuries.   Level V Caveat- Dementia  The history is provided by the patient.    Past Medical History  Diagnosis Date  . Dementia   . Thyroid disease     History reviewed. No pertinent past surgical history.  History reviewed. No pertinent family history.  History  Substance Use Topics  . Smoking status: Never Smoker   . Smokeless tobacco: Not on file  . Alcohol Use: No    OB History    Grav Para Term Preterm Abortions TAB SAB Ect Mult Living                  Review of Systems  Unable to perform ROS   Allergies  Sulfonamide derivatives and Penicillins  Home Medications   Current Outpatient Rx  Name Route Sig Dispense Refill  . LATANOPROST 0.005 % OP SOLN Both Eyes Place 1 drop into both eyes at bedtime.    Marland Kitchen LEVOTHYROXINE SODIUM 75 MCG PO TABS Oral Take 1 tablet (75 mcg total) by mouth daily. 30 tablet 11    Dispense as written.  . CENTRUM SILVER PO Oral Take 1 tablet by mouth daily.      Marland Kitchen CITALOPRAM HYDROBROMIDE 10 MG PO TABS Oral Take 5 mg by mouth every other day.      BP 138/57  Pulse 84  Temp 97.2 F (36.2 C) (Oral)  Resp 12  SpO2 99%  Physical Exam  Nursing note and vitals reviewed. Constitutional: She is oriented to person, place, and time. She appears well-developed and  well-nourished.       Frail  HENT:  Head: Normocephalic and atraumatic.  Eyes: Conjunctivae normal and EOM are normal. Pupils are equal, round, and reactive to light.  Neck: Normal range of motion and phonation normal. Neck supple.  Cardiovascular: Normal rate, regular rhythm and intact distal pulses.   Pulmonary/Chest: Effort normal and breath sounds normal. She exhibits no tenderness.  Abdominal: Soft. She exhibits no distension. There is no tenderness. There is no guarding.  Musculoskeletal: Normal range of motion.       Left hip tender to palpation, and with any passive motion  Neurological: She is alert and oriented to person, place, and time. She has normal strength. She exhibits normal muscle tone.  Skin: Skin is warm and dry.  Psychiatric: She has a normal mood and affect. Her behavior is normal.    ED Course  Procedures (including critical care time) Emergency department treatment: IV, fentanyl, and Zofran. Evaluation of the treatment found the patient to be pain-free  Case was discussed with Dr. Luiz Blare, orthopedics, he would like to perform an operative repair today. He wants the hospitalist admit the patient and clear her for surgery.   Date: 04/30/2012  Rate: 91  Rhythm: normal sinus rhythm  QRS Axis: normal  Intervals: normal  ST/T Wave abnormalities: nonspecific ST/T changes  Conduction Disutrbances:none  Narrative Interpretation:   Old EKG Reviewed: none available   Labs Reviewed  CBC WITH DIFFERENTIAL - Abnormal; Notable for the following:    WBC 14.0 (*)     Hemoglobin 15.5 (*)     Neutrophils Relative 80 (*)     Neutro Abs 11.2 (*)     All other components within normal limits  URINALYSIS, ROUTINE W REFLEX MICROSCOPIC - Abnormal; Notable for the following:    Leukocytes, UA TRACE (*)     All other components within normal limits  URINE MICROSCOPIC-ADD ON - Abnormal; Notable for the following:    Squamous Epithelial / LPF FEW (*)     All other components  within normal limits  BASIC METABOLIC PANEL  URINE CULTURE   Dg Chest 1 View  04/30/2012  *RADIOLOGY REPORT*  Clinical Data: Fall with left hip deformity.  CHEST - 1 VIEW  Comparison: CT chest 08/30/2011  Findings: Trachea is midline.  Heart size normal.  Biapical pleural parenchymal scarring.  Probable mild scarring at the lung bases versus mild fibrosis.  No pleural fluid.  IMPRESSION: No acute findings.  Bibasilar scarring versus mild fibrosis.   Original Report Authenticated By: Reyes Ivan, M.D.    Dg Hip Complete Left  04/30/2012  *RADIOLOGY REPORT*  Clinical Data: Fall with deformity of the left hip  LEFT HIP - COMPLETE 2+ VIEW  Comparison: None.  Findings: There is an acute basicervical fracture of the proximal left femur.  There is impaction and varus angulation of the fracture fragments.  The femoral head is located.   The right hip is unremarkable.  No significant degenerative changes of either hip, for patient age.  No acute pelvic fracture is identified.  Sacroiliac joints and pubic symphysis appear normal.  IMPRESSION: Acute basicervical proximal left femur fracture.  Per CMS PQRS reporting requirements (PQRS Measure 24): Given the patient's age of greater than 50 and the fracture site (hip, distal radius, or spine), the patient should be tested for osteoporosis using DXA, and the appropriate treatment considered based on the DXA results.   Original Report Authenticated By: Britta Mccreedy, M.D.      1. Hip fracture, left       MDM  Left hip fracture from fall. I cannot specify this is a mechanical fall. Patient needs orthopedic evaluation, and likely surgical repair. She is getting a hospitalist consultation for stabilization and clearance..  Plan: Admit- Admit        Flint Melter, MD 04/30/12 [phone]

## 2012-04-30 NOTE — ED Notes (Signed)
Pt returned from Xray. Placed back on monitors.

## 2012-04-30 NOTE — ED Notes (Signed)
Per EMS: fell about 45 minutes ago. Sitting at barstool at counter eating breakfast and went to get up and fell. Landed on left side. No complaints of pain anywhere else besides left hip. Shortening and rotating of left leg. Excruciating pain in left leg. Pt does not think that it is broke. Pts sister thinks there is early stage os possible dementia. No LOC or head injury. NKDA. BP: 120 palpated HR: 87, 98% RA. Pts sister states this is second fall in last couple months with no known injuries previously.

## 2012-04-30 NOTE — Transfer of Care (Signed)
Immediate Anesthesia Transfer of Care Note  Patient: Kristina Browning  Procedure(s) Performed: Procedure(s) (LRB) with comments: ARTHROPLASTY BIPOLAR HIP (Left)  Patient Location: PACU  Anesthesia Type: General  Level of Consciousness: sedated  Airway & Oxygen Therapy: Patient Spontanous Breathing and Patient connected to nasal cannula oxygen  Post-op Assessment: Report given to PACU RN and Post -op Vital signs reviewed and stable  Post vital signs: Reviewed and stable  Complications: No apparent anesthesia complications

## 2012-04-30 NOTE — ED Notes (Signed)
Lab at bedside

## 2012-04-30 NOTE — Consult Note (Signed)
Referring Physician:  Primary Physician: Primary Cardiologist: Reason for Consultation:    HPI:  Kristina Browning is a 78 y.o. Woman with h/o memory loss and hypothyroidism. No known heart disease. Apparently patient had witnessed mechanical fall with subsequent L hip fracture. She is pending repair. However, baseline ECG abnormal so we are called to evaluate preoperatively.  Says her husband died 5 years ago and she was very upset and developed CP/ Had stress test at [facility] 5 years ago which she says was normal. Denies any recurrent CP, exertional dyspnea or syncope.   I did bedside echo: very limited images but EF is normal 60% with no obvious wall motion abnormalities.  Review of Systems:     Cardiac Review of Systems: {Y] = yes [ ]  = no  Chest Pain [    ]  Resting SOB [   ] Exertional SOB  [  ]  Orthopnea [  ]   Pedal Edema [   ]    Palpitations [  ] Syncope  [  ]   Presyncope [   ]  General Review of Systems: [Y] = yes [  ]=no Constitional: recent weight change [  ]; anorexia [  ]; fatigue [  ]; nausea [  ]; night sweats [  ]; fever [  ]; or chills [  ];                                                                                                                                          Eye : blurred vision [  ]; diplopia [   ]; vision changes [  ];  Amaurosis fugax[  ]; Resp: cough [  ];  wheezing[  ];  hemoptysis[  ]; shortness of breath[  ]; paroxysmal nocturnal dyspnea[  ]; dyspnea on exertion[  ]; or orthopnea[  ];  GI:  gallstones[  ], vomiting[  ];  dysphagia[  ]; melena[  ];  hematochezia [  ]; heartburn[  ];   Hx of  Colonoscopy[  ]; GU: kidney stones [  ]; hematuria[  ];   dysuria [  ];  nocturia[  ];                  Skin: rash, swelling[  ];, hair loss[  ];  peripheral edema[  ];  or itching[  ]; Musculosketetal: myalgias[  ];  joint swelling[  ];  joint erythema[  ];  joint pain[ y ];  back pain[  ];  Heme/Lymph: bruising[  ];  bleeding[  ];  anemia[  ];  Neuro:  TIA[  ];  headaches[  ];  stroke[  ];  vertigo[  ];  seizures[  ];   paresthesias[  ];  difficulty walking[  ];  Psych:depression[ y ]; Lethea Killings  ];  Endocrine: diabetes[  ];  thyroid dysfunction[y  ];    Past  Medical History  Diagnosis Date  . Dementia   . Thyroid disease     Medications Prior to Admission  Medication Sig Dispense Refill  . latanoprost (XALATAN) 0.005 % ophthalmic solution Place 1 drop into both eyes at bedtime.      Marland Kitchen levothyroxine (SYNTHROID, LEVOTHROID) 75 MCG tablet Take 1 tablet (75 mcg total) by mouth daily.  30 tablet  11  . Multiple Vitamins-Minerals (CENTRUM SILVER PO) Take 1 tablet by mouth daily.        . citalopram (CELEXA) 10 MG tablet Take 5 mg by mouth every other day.            . fentaNYL  100 mcg Intravenous Once  . latanoprost  1 drop Both Eyes QHS  . levothyroxine  75 mcg Oral Daily  . metoprolol tartrate  12.5 mg Oral BID  . morphine      . ondansetron (ZOFRAN) IV  4 mg Intravenous Once    Infusions:    . sodium chloride    . DISCONTD: sodium chloride 125 mL/hr at 04/30/12 1247    Allergies  Allergen Reactions  . Sulfonamide Derivatives Hives    unsure  . Penicillins Rash    History   Social History  . Marital Status: Single    Spouse Name: N/A    Number of Children: N/A  . Years of Education: N/A   Occupational History  . Not on file.   Social History Main Topics  . Smoking status: Never Smoker   . Smokeless tobacco: Not on file  . Alcohol Use: No  . Drug Use: No  . Sexually Active: Not on file   Other Topics Concern  . Not on file   Social History Narrative  . No narrative on file  Married for 54 years. Husband died 5 years ago from lung condition. Lives in [place]. Non-smoker. Has 2 sons.  History reviewed. No pertinent family history. No family h/o premature CAD.  PHYSICAL EXAM: Filed Vitals:   04/30/12 1500  BP: 133/71  Pulse: 91  Temp:   Resp: 20    No intake or output data in the 24 hours  ending 04/30/12 1818  General:  Elderly frail. No respiratory difficulty. Lying flat. HEENT: normal Neck: supple. no JVD. Carotids 2+ bilat; no bruits. No lymphadenopathy or thryomegaly appreciated. Cor: PMI nondisplaced. Regular rate & rhythm. No rubs, gallops or murmurs. Lungs: clear Abdomen: soft, nontender, nondistended. No hepatosplenomegaly. No bruits or masses. Good bowel sounds. Extremities: no cyanosis, clubbing, rash, edema L hip painful Neuro: alert & oriented x 3, cranial nerves grossly intact. moves all 4 extremities w/o difficulty. Affect pleasant.  ECG: SR 91. Nonspecific ST-T wave abnormalities inferolateral leads (no old to compare)  Results for orders placed during the hospital encounter of 04/30/12 (from the past 24 hour(s))  CBC WITH DIFFERENTIAL     Status: Abnormal   Collection Time   04/30/12  1:15 PM      Component Value Range   WBC 14.0 (*) 4.0 - 10.5 K/uL   RBC 4.77  3.87 - 5.11 MIL/uL   Hemoglobin 15.5 (*) 12.0 - 15.0 g/dL   HCT 40.9  81.1 - 91.4 %   MCV 95.0  78.0 - 100.0 fL   MCH 32.5  26.0 - 34.0 pg   MCHC 34.2  30.0 - 36.0 g/dL   RDW 78.2  95.6 - 21.3 %   Platelets 244  150 - 400 K/uL   Neutrophils Relative 80 (*) 43 -  77 %   Neutro Abs 11.2 (*) 1.7 - 7.7 K/uL   Lymphocytes Relative 12  12 - 46 %   Lymphs Abs 1.7  0.7 - 4.0 K/uL   Monocytes Relative 6  3 - 12 %   Monocytes Absolute 0.9  0.1 - 1.0 K/uL   Eosinophils Relative 1  0 - 5 %   Eosinophils Absolute 0.1  0.0 - 0.7 K/uL   Basophils Relative 1  0 - 1 %   Basophils Absolute 0.1  0.0 - 0.1 K/uL  BASIC METABOLIC PANEL     Status: Abnormal   Collection Time   04/30/12  1:15 PM      Component Value Range   Sodium 138  135 - 145 mEq/L   Potassium 3.8  3.5 - 5.1 mEq/L   Chloride 104  96 - 112 mEq/L   CO2 25  19 - 32 mEq/L   Glucose, Bld 108 (*) 70 - 99 mg/dL   BUN 16  6 - 23 mg/dL   Creatinine, Ser 0.86  0.50 - 1.10 mg/dL   Calcium 9.5  8.4 - 57.8 mg/dL   GFR calc non Af Amer 50 (*) >90  mL/min   GFR calc Af Amer 58 (*) >90 mL/min  URINALYSIS, ROUTINE W REFLEX MICROSCOPIC     Status: Abnormal   Collection Time   04/30/12  1:46 PM      Component Value Range   Color, Urine YELLOW  YELLOW   APPearance CLEAR  CLEAR   Specific Gravity, Urine 1.014  1.005 - 1.030   pH 7.0  5.0 - 8.0   Glucose, UA NEGATIVE  NEGATIVE mg/dL   Hgb urine dipstick NEGATIVE  NEGATIVE   Bilirubin Urine NEGATIVE  NEGATIVE   Ketones, ur NEGATIVE  NEGATIVE mg/dL   Protein, ur NEGATIVE  NEGATIVE mg/dL   Urobilinogen, UA 1.0  0.0 - 1.0 mg/dL   Nitrite NEGATIVE  NEGATIVE   Leukocytes, UA TRACE (*) NEGATIVE  URINE MICROSCOPIC-ADD ON     Status: Abnormal   Collection Time   04/30/12  1:46 PM      Component Value Range   Squamous Epithelial / LPF FEW (*) RARE   WBC, UA 0-2  <3 WBC/hpf   RBC / HPF 0-2  <3 RBC/hpf   Bacteria, UA RARE  RARE  TROPONIN I     Status: Normal   Collection Time   04/30/12  3:02 PM      Component Value Range   Troponin I <0.30  <0.30 ng/mL   Dg Chest 1 View  04/30/2012  *RADIOLOGY REPORT*  Clinical Data: Fall with left hip deformity.  CHEST - 1 VIEW  Comparison: CT chest 08/30/2011  Findings: Trachea is midline.  Heart size normal.  Biapical pleural parenchymal scarring.  Probable mild scarring at the lung bases versus mild fibrosis.  No pleural fluid.  IMPRESSION: No acute findings.  Bibasilar scarring versus mild fibrosis.   Original Report Authenticated By: Reyes Ivan, M.D.    Dg Hip Complete Left  04/30/2012  *RADIOLOGY REPORT*  Clinical Data: Fall with deformity of the left hip  LEFT HIP - COMPLETE 2+ VIEW  Comparison: None.  Findings: There is an acute basicervical fracture of the proximal left femur.  There is impaction and varus angulation of the fracture fragments.  The femoral head is located.   The right hip is unremarkable.  No significant degenerative changes of either hip, for patient age.  No acute pelvic fracture is  identified.  Sacroiliac joints and pubic  symphysis appear normal.  IMPRESSION: Acute basicervical proximal left femur fracture.  Per CMS PQRS reporting requirements (PQRS Measure 24): Given the patient's age of greater than 50 and the fracture site (hip, distal radius, or spine), the patient should be tested for osteoporosis using DXA, and the appropriate treatment considered based on the DXA results.   Original Report Authenticated By: Britta Mccreedy, M.D.      ASSESSMENT: 1. L hip fracture 2. Abnormal ECG  PLAN/DISCUSSION:  Her ECG abnormalities are non-specific. We have no old ECGs to compare. She had a negative stress test 5 years ago and denies any anginal symptoms at baseline. I performed echo at bedside. Very limited images but LV function is clearly normal with no obvious wall motion abnormalities. I could not visualize aortic valve well but she does not have any AS murmur on exam.   Overall, I think she is low risk for peri-operative cardiac complications. Can proceed to surgery from our standpoint without any further cardiac testing. Please call with any questions.   Truman Hayward 6:38 PM

## 2012-04-30 NOTE — ED Notes (Signed)
Pt states was eating breakfast at barstool and went to get down and fell. Pt reports pain only in left hip from fall. Pts legs currently stabilized. Pt states pain starts when she relaxes left leg.

## 2012-04-30 NOTE — ED Notes (Signed)
Pts son states that the pts sister stated that pt hit head when she fell. Pt is unsure whether she hit her head or not. Pt denies pain in the head.

## 2012-04-30 NOTE — Preoperative (Signed)
Beta Blockers   Reason not to administer Beta Blockers:Not Applicable. No home beta blockers 

## 2012-04-30 NOTE — ED Notes (Signed)
Pt brought in by [facility]

## 2012-04-30 NOTE — ED Notes (Signed)
Internal medicine at beside.

## 2012-04-30 NOTE — ED Notes (Signed)
Pt being prepared for transport. Pt belongings given to pts son.

## 2012-04-30 NOTE — Consult Note (Signed)
Reason for Consult:l hip pain Referring Physician: hospitalists  Kristina Browning is an 78 y.o. female.  HPI: 78 yo female who fell onto l. Side.  Pt c/o l hip pain.  X-RAYS show femoral neck fracture and we are consulted  Past Medical History  Diagnosis Date  . Dementia   . Thyroid disease     History reviewed. No pertinent past surgical history.  History reviewed. No pertinent family history.  Social History:  reports that she has never smoked. She does not have any smokeless tobacco history on file. She reports that she does not drink alcohol or use illicit drugs.  Allergies:  Allergies  Allergen Reactions  . Sulfonamide Derivatives Hives    unsure  . Penicillins Rash    Medications: I have reviewed the patient's current medications.  Results for orders placed during the hospital encounter of 04/30/12 (from the past 48 hour(s))  CBC WITH DIFFERENTIAL     Status: Abnormal   Collection Time   04/30/12  1:15 PM      Component Value Range Comment   WBC 14.0 (*) 4.0 - 10.5 K/uL    RBC 4.77  3.87 - 5.11 MIL/uL    Hemoglobin 15.5 (*) 12.0 - 15.0 g/dL    HCT 16.1  09.6 - 04.5 %    MCV 95.0  78.0 - 100.0 fL    MCH 32.5  26.0 - 34.0 pg    MCHC 34.2  30.0 - 36.0 g/dL    RDW 40.9  81.1 - 91.4 %    Platelets 244  150 - 400 K/uL    Neutrophils Relative 80 (*) 43 - 77 %    Neutro Abs 11.2 (*) 1.7 - 7.7 K/uL    Lymphocytes Relative 12  12 - 46 %    Lymphs Abs 1.7  0.7 - 4.0 K/uL    Monocytes Relative 6  3 - 12 %    Monocytes Absolute 0.9  0.1 - 1.0 K/uL    Eosinophils Relative 1  0 - 5 %    Eosinophils Absolute 0.1  0.0 - 0.7 K/uL    Basophils Relative 1  0 - 1 %    Basophils Absolute 0.1  0.0 - 0.1 K/uL   BASIC METABOLIC PANEL     Status: Abnormal   Collection Time   04/30/12  1:15 PM      Component Value Range Comment   Sodium 138  135 - 145 mEq/L    Potassium 3.8  3.5 - 5.1 mEq/L    Chloride 104  96 - 112 mEq/L    CO2 25  19 - 32 mEq/L    Glucose, Bld 108 (*) 70 - 99  mg/dL    BUN 16  6 - 23 mg/dL    Creatinine, Ser 7.82  0.50 - 1.10 mg/dL    Calcium 9.5  8.4 - 95.6 mg/dL    GFR calc non Af Amer 50 (*) >90 mL/min    GFR calc Af Amer 58 (*) >90 mL/min   URINALYSIS, ROUTINE W REFLEX MICROSCOPIC     Status: Abnormal   Collection Time   04/30/12  1:46 PM      Component Value Range Comment   Color, Urine YELLOW  YELLOW    APPearance CLEAR  CLEAR    Specific Gravity, Urine 1.014  1.005 - 1.030    pH 7.0  5.0 - 8.0    Glucose, UA NEGATIVE  NEGATIVE mg/dL    Hgb urine dipstick NEGATIVE  NEGATIVE    Bilirubin Urine NEGATIVE  NEGATIVE    Ketones, ur NEGATIVE  NEGATIVE mg/dL    Protein, ur NEGATIVE  NEGATIVE mg/dL    Urobilinogen, UA 1.0  0.0 - 1.0 mg/dL    Nitrite NEGATIVE  NEGATIVE    Leukocytes, UA TRACE (*) NEGATIVE   URINE MICROSCOPIC-ADD ON     Status: Abnormal   Collection Time   04/30/12  1:46 PM      Component Value Range Comment   Squamous Epithelial / LPF FEW (*) RARE    WBC, UA 0-2  <3 WBC/hpf    RBC / HPF 0-2  <3 RBC/hpf    Bacteria, UA RARE  RARE     Dg Chest 1 View  04/30/2012  *RADIOLOGY REPORT*  Clinical Data: Fall with left hip deformity.  CHEST - 1 VIEW  Comparison: CT chest 08/30/2011  Findings: Trachea is midline.  Heart size normal.  Biapical pleural parenchymal scarring.  Probable mild scarring at the lung bases versus mild fibrosis.  No pleural fluid.  IMPRESSION: No acute findings.  Bibasilar scarring versus mild fibrosis.   Original Report Authenticated By: Reyes Ivan, M.D.    Dg Hip Complete Left  04/30/2012  *RADIOLOGY REPORT*  Clinical Data: Fall with deformity of the left hip  LEFT HIP - COMPLETE 2+ VIEW  Comparison: None.  Findings: There is an acute basicervical fracture of the proximal left femur.  There is impaction and varus angulation of the fracture fragments.  The femoral head is located.   The right hip is unremarkable.  No significant degenerative changes of either hip, for patient age.  No acute pelvic  fracture is identified.  Sacroiliac joints and pubic symphysis appear normal.  IMPRESSION: Acute basicervical proximal left femur fracture.  Per CMS PQRS reporting requirements (PQRS Measure 24): Given the patient's age of greater than 50 and the fracture site (hip, distal radius, or spine), the patient should be tested for osteoporosis using DXA, and the appropriate treatment considered based on the DXA results.   Original Report Authenticated By: Britta Mccreedy, M.D.     ROS ROS: I have reviewed the patient's review of systems thoroughly and there are no positive responses as relates to the HPI. EXAM: Blood pressure 133/71, pulse 91, temperature 97.2 F (36.2 C), temperature source Oral, resp. rate 20, SpO2 93.00%. Well-developed well-nourished patient in no acute distress. Alert and oriented x3 HEENT:within normal limits Cardiac: Regular rate and rhythm Pulmonary: Lungs clear to auscultation Abdomen: Soft and nontender.  Normal active bowel sounds  Musculoskeletal: (l hip ext rotated and shortened pain with rom)  Assessment/Plan: 78 yo female with l hip fracture after a fall.  ///Pt will be admitted to int med and taken to OR for l hemi-arthroplasty when cleared by int med.  [name] L 04/30/2012, 3:08 PM

## 2012-04-30 NOTE — ED Notes (Signed)
Pt currently denies pain. Pt placed on the fracture bedpan.

## 2012-04-30 NOTE — Telephone Encounter (Signed)
Dr Judithe [facility] called pt fell at home fx femur; pt being admitted to ortho. Pt has T wave inversions and Dr Janee Morn request EKG for comparison. Unable to locate EKG in Epic, [facility] or no paper chart. Dr Janee Morn notified while on phone.

## 2012-04-30 NOTE — ED Notes (Signed)
Pt states that pain has decreased a little in her left hip. When asked if she would like more pain medication; pt states no she does not want any more pain medication at this time.

## 2012-04-30 NOTE — H&P (Signed)
[facility] History and Physical  Kristina Browning WUJ:[id number] DOB: 1926/06/24 DOA: 04/30/2012  Referring physician: Dr Effie Shy PCP: Ruthe Mannan, MD  Specialists: Orthopedics: Dr Luiz Blare per ED physician  Chief Complaint: Hip pain  HPI: Kristina Browning is a 78 y.o. female with history of memory loss, worsening dementia, hypothyroidism who presents to the ED after a fall on the day of admission. Patient has some the history and per patient and sons patient was in the kitchen eating breakfast when she got up and subsequently fell on her left side. Patient denies any syncopal episode. Patient denies any fever, no chills, no chest pain, no shortness of breath, no diarrhea, no constipation, no nausea, no vomiting, no weakness, no cough, no other associated symptoms. Patient did have significant pain in her left hip and subsequently called her sons was subsequently brought her to the ED. Per patient's son's patient has had some episodes of dizziness whenever she ate sausage in the mornings and when she stood up with subsequent issues of almost falling and being caught. Patient and sons denies any cardiac history. No of other associated symptoms. Patient was seen in the ED EKG which was done did show some T wave inversions in leads 2,3 aVF as well as V5 and V6. CBC done did have a white count of 14,000. Basic metabolic profile which was done was unremarkable. Urinalysis which was done was negative. Chest x-ray which was done was negative. Plain films of the left hip which was done did show an acute basicervical proximal left femur fracture. ED physician states he spoke to Dr. Luiz Blare of orthopedics and we were called to admit the patient for further evaluation and management.   Review of Systems: The patient denies anorexia, fever, weight loss,, vision loss, decreased hearing, hoarseness, chest pain, syncope, dyspnea on exertion, peripheral edema, balance deficits, hemoptysis, abdominal pain, melena,  hematochezia, severe indigestion/heartburn, hematuria, incontinence, genital sores, muscle weakness, suspicious skin lesions, transient blindness, difficulty walking, depression, unusual weight change, abnormal bleeding, enlarged lymph nodes, angioedema, and breast masses.    Past Medical History  Diagnosis Date  . Dementia   . Thyroid disease    History reviewed. No pertinent past surgical history. Social History:  reports that she has never smoked. She does not have any smokeless tobacco history on file. She reports that she does not drink alcohol or use illicit drugs.  Allergies  Allergen Reactions  . Sulfonamide Derivatives Hives    unsure  . Penicillins Rash    History reviewed. No pertinent family history.   Prior to Admission medications   Medication Sig Start Date End Date Taking? Authorizing Provider  latanoprost (XALATAN) 0.005 % ophthalmic solution Place 1 drop into both eyes at bedtime.   Yes Historical Provider, MD  levothyroxine (SYNTHROID, LEVOTHROID) 75 MCG tablet Take 1 tablet (75 mcg total) by mouth daily. 10/25/11  Yes Dianne Dun, MD  Multiple Vitamins-Minerals (CENTRUM SILVER PO) Take 1 tablet by mouth daily.     Yes Historical Provider, MD  citalopram (CELEXA) 10 MG tablet Take 5 mg by mouth every other day. 11/14/10 11/14/11  Dianne Dun, MD   Physical Exam: Filed Vitals:   04/30/12 1137 04/30/12 1330 04/30/12 1415 04/30/12 1500  BP: 143/71 138/57 132/105 133/71  Pulse: 78 84 90 91  Temp: 97.2 F (36.2 C)     TempSrc: Oral     Resp: 18 12 27 20   SpO2: 99% 99% 100% 93%     General:  Well-developed well-nourished in  no acute cardiopulmonary distress.  Eyes: Pupils equal round and reactive to light and accommodation. Extraocular movements intact.  ENT: Oropharynx is clear, no lesions, no exudates.  Neck: Supple with no lymphadenopathy.  Cardiovascular: Regular rate rhythm no murmurs rubs or gallops. No JVD. No lower extremity edema.  Respiratory:  Clear to auscultation in the anterior lung fields. No wheezing, no rhonchi, no rales.  Abdomen: Soft, nontender, nondistended, positive bowel sounds.  Skin: No lesions no rashes  Musculoskeletal: 5 out of 5 bilateral upper extremity strength. Left lower extremity is externally rotated.  Psychiatric: Alert. Normal mood. Normal affect. Poor to fair insight. Poor to fair judgment.  Neurologic: Alert. Cranial nerves II through XII are grossly intact. No focal deficits.  Lower extremities: No clubbing cyanosis or edema. Left lower extremity is externally rotated.  Labs on Admission:  Basic Metabolic Panel:  Lab 04/30/12 1610  NA 138  K 3.8  CL 104  CO2 25  GLUCOSE 108*  BUN 16  CREATININE 1.01  CALCIUM 9.5  MG --  PHOS --   Liver Function Tests: No results found for this basename: AST:5,ALT:5,ALKPHOS:5,BILITOT:5,PROT:5,ALBUMIN:5 in the last 168 hours No results found for this basename: LIPASE:5,AMYLASE:5 in the last 168 hours No results found for this basename: AMMONIA:5 in the last 168 hours CBC:  Lab 04/30/12 1315  WBC 14.0*  NEUTROABS 11.2*  HGB 15.5*  HCT 45.3  MCV 95.0  PLT 244   Cardiac Enzymes: No results found for this basename: CKTOTAL:5,CKMB:5,CKMBINDEX:5,TROPONINI:5 in the last 168 hours  BNP (last 3 results) No results found for this basename: PROBNP:3 in the last 8760 hours CBG: No results found for this basename: GLUCAP:5 in the last 168 hours  Radiological Exams on Admission: Dg Chest 1 View  04/30/2012  *RADIOLOGY REPORT*  Clinical Data: Fall with left hip deformity.  CHEST - 1 VIEW  Comparison: CT chest 08/30/2011  Findings: Trachea is midline.  Heart size normal.  Biapical pleural parenchymal scarring.  Probable mild scarring at the lung bases versus mild fibrosis.  No pleural fluid.  IMPRESSION: No acute findings.  Bibasilar scarring versus mild fibrosis.   Original Report Authenticated By: Reyes Ivan, M.D.    Dg Hip Complete  Left  04/30/2012  *RADIOLOGY REPORT*  Clinical Data: Fall with deformity of the left hip  LEFT HIP - COMPLETE 2+ VIEW  Comparison: None.  Findings: There is an acute basicervical fracture of the proximal left femur.  There is impaction and varus angulation of the fracture fragments.  The femoral head is located.   The right hip is unremarkable.  No significant degenerative changes of either hip, for patient age.  No acute pelvic fracture is identified.  Sacroiliac joints and pubic symphysis appear normal.  IMPRESSION: Acute basicervical proximal left femur fracture.  Per CMS PQRS reporting requirements (PQRS Measure 24): Given the patient's age of greater than 50 and the fracture site (hip, distal radius, or spine), the patient should be tested for osteoporosis using DXA, and the appropriate treatment considered based on the DXA results.   Original Report Authenticated By: Britta Mccreedy, M.D.     EKG: Independently reviewed. T wave inversion in leads 2,3 aVF and leads V5 through V6.  Assessment/Plan Principal Problem:  *Femur fracture, left Active Problems:  Unspecified hypothyroidism  HIP PAIN, LEFT  Memory loss  Fall  Abnormal finding on EKG  Leukocytosis   #1 left femur fracture ?? Etiology. May be Secondary to mechanical fall. Patient denies any syncopal episode. Patient has  had a history of dizziness sometimes in the mornings after eating sausage per son's and almost falls and from time to time has been:. EKG does show T-wave inversions in leads 23 aVF and V5 through V6. No old EKG to compare. Call PCPs office and there was no old EKG on file noted. We'll cycle cardiac enzymes every 6 hours x3. Will check a 2-D echo. If enzymes are negative and 2-D echo is normal patient will be okay for surgery. Due to patient's age patient is mild to moderate risk. Will place patient perioperatively on a low-dose beta blocker. And follow. If 2-D echo is abnormal or cardiac enzymes are elevated we'll consult  with cardiology for further evaluation and management. Orthopedics has been consulted per ED physician and Dr. Luiz Blare to assess the patient.  #2 abnormal EKG EKG does have T wave inversions in leads 2,3 aVF V5 and V6. No old EKG to compare it to.. Call patient's PCPs office and no old EKG found at this time. We'll cycle cardiac enzymes every 6 hours x3. Will check a 2-D echo. If enzymes are abnormal a 2-D echo is abnormal we'll need to consult with cardiology. If enzymes are negative and 2-D echo is within normal limits no further cardiac workup is needed at this time.  #3 hypothyroidism Will check a TSH. Continue home dose of Synthroid.  #4 leukocytosis Likely reactive secondary to problem #1. Chest x-ray is negative. Urinalysis is negative. No need for antibiotics at this time. We'll follow for now.  #5 memory loss/dementia Monitor for now. We'll place on Ativan as needed for agitation.  #6 prophylaxis SCDs for DVT prophylaxis. Postoperatively will defer her DVT prophylaxis to orthopedics.  Code Status: DNR Family Communication: Updated patient and on at bedsideReilley Valentine [facility] [phone]) Disposition Plan: Admit to inpatient MedSurg bed  Time spent: 60 mins  [facility] Pager [phone]  If 7PM-7AM, please contact night-coverage [email or web address] Password [email or web address] 04/30/2012, 3:12 PM

## 2012-04-30 NOTE — ED Notes (Signed)
Family at bedside. 

## 2012-05-01 ENCOUNTER — Inpatient Hospital Stay (HOSPITAL_COMMUNITY): Payer: Medicare Other

## 2012-05-01 ENCOUNTER — Encounter (HOSPITAL_COMMUNITY): Payer: Self-pay | Admitting: General Practice

## 2012-05-01 DIAGNOSIS — S72002A Fracture of unspecified part of neck of left femur, initial encounter for closed fracture: Secondary | ICD-10-CM | POA: Diagnosis present

## 2012-05-01 DIAGNOSIS — N390 Urinary tract infection, site not specified: Secondary | ICD-10-CM | POA: Diagnosis present

## 2012-05-01 LAB — BASIC METABOLIC PANEL
BUN: 16 mg/dL (ref 6–23)
CO2: 23 mEq/L (ref 19–32)
Chloride: 106 mEq/L (ref 96–112)
GFR calc non Af Amer: 50 mL/min — ABNORMAL LOW (ref >90)
Glucose, Bld: 144 mg/dL — ABNORMAL HIGH (ref 70–99)
Potassium: 4.3 mEq/L (ref 3.5–5.1)
Sodium: 137 mEq/L (ref 135–145)

## 2012-05-01 LAB — URINE CULTURE: Culture: NO GROWTH

## 2012-05-01 LAB — CBC
HCT: 38.9 % (ref 36.0–46.0)
Hemoglobin: 13.3 g/dL (ref 12.0–15.0)
RBC: 4.07 MIL/uL (ref 3.87–5.11)

## 2012-05-01 LAB — URINALYSIS, ROUTINE W REFLEX MICROSCOPIC
Bilirubin Urine: NEGATIVE
Glucose, UA: NEGATIVE mg/dL
Hgb urine dipstick: NEGATIVE
Specific Gravity, Urine: 1.025 (ref 1.005–1.030)
Urobilinogen, UA: 0.2 mg/dL (ref 0.0–1.0)

## 2012-05-01 LAB — PROTIME-INR: Prothrombin Time: 14.1 seconds (ref 11.6–15.2)

## 2012-05-01 LAB — TROPONIN I: Troponin I: <0.3 ng/mL (ref <0.30)

## 2012-05-01 LAB — URINE MICROSCOPIC-ADD ON

## 2012-05-01 MED ORDER — DEXTROSE 5 % IV SOLN
1.0000 g | INTRAVENOUS | Status: DC
  Administered 2012-05-01 – 2012-05-05 (×5): 1 g via INTRAVENOUS
  Filled 2012-05-01 (×5): qty 10

## 2012-05-01 MED ORDER — WARFARIN SODIUM 4 MG PO TABS
4.0000 mg | ORAL_TABLET | Freq: Every day | ORAL | Status: DC
  Administered 2012-05-01: 4 mg via ORAL
  Filled 2012-05-01 (×3): qty 1

## 2012-05-01 MED ORDER — CEFAZOLIN SODIUM-DEXTROSE 2-3 GM-% IV SOLR
2.0000 g | Freq: Four times a day (QID) | INTRAVENOUS | Status: AC
  Administered 2012-05-01 (×2): 2 g via INTRAVENOUS
  Filled 2012-05-01 (×2): qty 50

## 2012-05-01 MED ORDER — CITALOPRAM HYDROBROMIDE 10 MG PO TABS
5.0000 mg | ORAL_TABLET | ORAL | Status: DC
  Filled 2012-05-01 (×3): qty 1

## 2012-05-01 MED ORDER — FERROUS SULFATE 325 (65 FE) MG PO TABS
325.0000 mg | ORAL_TABLET | Freq: Two times a day (BID) | ORAL | Status: DC
  Administered 2012-05-01 – 2012-05-05 (×9): 325 mg via ORAL
  Filled 2012-05-01 (×11): qty 1

## 2012-05-01 MED ORDER — WARFARIN VIDEO: Freq: Once | Status: DC

## 2012-05-01 MED ORDER — ONDANSETRON HCL 4 MG/2ML IJ SOLN: 4.0000 mg | Freq: Four times a day (QID) | INTRAMUSCULAR | Status: DC | PRN

## 2012-05-01 MED ORDER — WARFARIN - PHARMACIST DOSING INPATIENT
Freq: Every day | Status: DC
  Administered 2012-05-01: 18:00:00

## 2012-05-01 MED ORDER — ONDANSETRON HCL 4 MG PO TABS: 4.0000 mg | ORAL_TABLET | Freq: Four times a day (QID) | ORAL | Status: DC | PRN

## 2012-05-01 MED ORDER — COUMADIN BOOK
Freq: Once | Status: DC
  Filled 2012-05-01: qty 1

## 2012-05-01 MED ORDER — SODIUM CHLORIDE 0.9 % IV BOLUS (SEPSIS)
250.0000 mL | Freq: Once | INTRAVENOUS | Status: AC
  Administered 2012-05-01: 250 mL via INTRAVENOUS

## 2012-05-01 NOTE — Progress Notes (Signed)
Physical Therapy Treatment Patient Details Name: Kristina Browning MRN: [id number] DOB: 1925/09/20 Today's Date: 05/01/2012 Time: 4098-1191 PT Time Calculation (min): 35 min  PT Assessment / Plan / Recommendation Comments on Treatment Session  Pt mobility continues to be hindered by pain.  Educated pt and family on relaxation techniques (deep and pursed lip breathing) to relieve pain.  Pt very anxious, tensing muscles in L LE and elevating her pain.  Pt had two crying episodes after session concluded.      Follow Up Recommendations  Post acute inpatient;Supervision/Assistance - 24 hour     Does the patient have the potential to tolerate intense rehabilitation  No, Recommend SNF  Barriers to Discharge        Equipment Recommendations  None recommended by PT    Recommendations for Other Services OT consult  Frequency 7X/week   Plan Discharge plan remains appropriate;Frequency remains appropriate    Precautions / Restrictions Precautions Precautions: Posterior Hip Precaution Booklet Issued: Yes (comment) Precaution Comments: Educated pt and family on posterior hip precautions.  Required Braces or Orthoses: Knee Immobilizer - Left Knee Immobilizer - Left: On at all times Restrictions Weight Bearing Restrictions: Yes LLE Weight Bearing: Weight bearing as tolerated   Pertinent Vitals/Pain Pt constantly c/o pain.  Unable to rate but states " I have never hurt this bad before".      Mobility  Bed Mobility Bed Mobility: Sit to Supine Supine to Sit: Not tested (comment) Sitting - Scoot to Edge of Bed: Not tested (comment) Sit to Supine: 1: +2 Total assist;HOB flat Sit to Supine: Patient Percentage: 10% Details for Bed Mobility Assistance: Assist to manage bilateral LEs and trunk secondary to pain.  Pt with minimal participation.   Transfers Transfers: Sit to Stand;Stand to Sit Sit to Stand: 1: +2 Total assist;From chair/3-in-1;With armrests Sit to Stand: Patient Percentage:  30% Stand to Sit: 1: +2 Total assist;To bed;With upper extremity assist;To elevated surface Stand to Sit: Patient Percentage: 30% Details for Transfer Assistance: Asssist to initiate standing secondary to pain. Cues for hand and L LE Placement  Ambulation/Gait Ambulation/Gait Assistance: 1: +2 Total assist Ambulation/Gait: Patient Percentage: 20% Ambulation Distance (Feet): 5 Feet Assistive device: Rolling walker Ambulation/Gait Assistance Details: Pt continues to have difficulty advancing either LE secondary to pain in L hip.  Manual facilitation to advance LEs and manage walker. Attempted rotational manual facilitation through pt's right hip to advance R LE unsuccessfully.       Gait Pattern: Step-to pattern;Decreased stride length;Decreased hip/knee flexion - left;Decreased hip/knee flexion - right;Decreased weight shift to left;Decreased stance time - left;Narrow base of support Gait velocity: slow  General Gait Details: Pt required greater than 1 minute to recover after each step with R LE.   Stairs: No Wheelchair Mobility Wheelchair Mobility: No    Exercises     PT Diagnosis:    PT Problem List:   PT Treatment Interventions:     PT Goals Acute Rehab PT Goals PT Goal Formulation: With patient/family Time For Goal Achievement: 05/15/12 Potential to Achieve Goals: Fair Pt will go Supine/Side to Sit: with min assist;with HOB 0 degrees PT Goal: Supine/Side to Sit - Progress: Not met Pt will Sit at [place] of Bed: with supervision;3-5 min;with no upper extremity support PT Goal: Sit at Edge Of Bed - Progress: Not met Pt will go Sit to Supine/Side: with min assist;with HOB 0 degrees PT Goal: Sit to Supine/Side - Progress: Not met Pt will go Sit to Stand: with min assist;with upper extremity  assist PT Goal: Sit to Stand - Progress: Not met Pt will go Stand to Sit: with min assist;with upper extremity assist PT Goal: Stand to Sit - Progress: Not met Pt will Transfer Bed to Chair/Chair  to Bed: with min assist PT Transfer Goal: Bed to Chair/Chair to Bed - Progress: Not met Pt will Ambulate: 16 - 50 feet;with supervision;with rolling walker PT Goal: Ambulate - Progress: Not met  Visit Information  Last PT Received On: 05/01/12 Assistance Needed: +2    Subjective Data  Subjective: I never had pain this bad.     Cognition  Overall Cognitive Status: Appears within functional limits for tasks assessed/performed Arousal/Alertness: Awake/alert Orientation Level: Appears intact for tasks assessed Behavior During Session: Anxious Cognition - Other Comments: Pt perseverates on talking about her "two wonderful sons" and her deceased spouse.      Balance  Balance Balance Assessed: No  End of Session PT - End of Session Equipment Utilized During Treatment: Gait belt Activity Tolerance: Patient limited by pain Patient left: Other (comment) Nurse Communication: Mobility status;Weight bearing status   GP     Mase Dhondt 05/01/2012, 4:55 PM Spurgeon Gancarz L. Reginna Sermeno DPT [phone]

## 2012-05-01 NOTE — Evaluation (Signed)
Physical Therapy Evaluation Patient Details Name: Kristina Browning MRN: [id number] DOB: July 22, 1926 Today's Date: 05/01/2012 Time: 1191-4782 PT Time Calculation (min): 33 min  PT Assessment / Plan / Recommendation Clinical Impression       PT Assessment  Patient needs continued PT services    Follow Up Recommendations  Post acute inpatient;Supervision/Assistance - 24 hour    Does the patient have the potential to tolerate intense rehabilitation   No, Recommend SNF  Barriers to Discharge Decreased caregiver support      Equipment Recommendations  None recommended by PT    Recommendations for Other Services     Frequency 7X/week    Precautions / Restrictions Precautions Precautions: Posterior Hip Precaution Booklet Issued: Yes (comment) Precaution Comments: Educated pt and family on posterior hip precautions.  Required Braces or Orthoses: Knee Immobilizer - Left Knee Immobilizer - Left: On at all times Restrictions Weight Bearing Restrictions: Yes LLE Weight Bearing: Weight bearing as tolerated   Pertinent Vitals/Pain Pt unable to rate pain in L hip but c/o severe pain throughout session.  Pt medicated prior to session.        Mobility  Bed Mobility Bed Mobility: Supine to Sit;Sitting - Scoot to Edge of Bed Supine to Sit: 1: +2 Total assist Supine to Sit: Patient Percentage: 20% Sitting - Scoot to Edge of Bed: 1: +2 Total assist Sitting - Scoot to Edge of Bed: Patient Percentage: 20% Details for Bed Mobility Assistance: Assist for L LE.  Pt presents with poor tolerance to any ROM of  L hip and increased pain with R hip Abduction.  Assist to manage bilateral LEs and trunk to sit up from supine. Transfers Transfers: Sit to Stand;Stand to Sit Sit to Stand: 1: +1 Total assist;From bed;With upper extremity assist Stand to Sit: 1: +1 Total assist;With upper extremity assist;To chair/3-in-1 Details for Transfer Assistance: Asssist to initiate standing secondary to pain.   Cues for hand and L LE Placement  Ambulation/Gait Ambulation/Gait Assistance: 2: Max assist Ambulation Distance (Feet): 10 Feet Assistive device: Rolling walker Ambulation/Gait Assistance Details: Cues to increase bilateral step length and increase WB in L LE in L stance phase. Pt has difficulty advancing R LE secondary to L hip pain. Cues for gait sequencing and assist to steady pt and support pt's wt through her trunk to decrease pain in L stance phase Gait Pattern: Step-to pattern;Decreased stride length;Decreased hip/knee flexion - left;Decreased hip/knee flexion - right;Decreased weight shift to left;Decreased stance time - left;Narrow base of support Gait velocity: slow  Stairs: No    Shoulder Instructions     Exercises     PT Diagnosis: Difficulty walking;Generalized weakness;Acute pain  PT Problem List: Decreased strength;Decreased range of motion;Decreased activity tolerance;Decreased mobility;Decreased balance;Decreased coordination;Decreased knowledge of use of DME;Decreased knowledge of precautions;Pain PT Treatment Interventions: DME instruction;Gait training;Functional mobility training;Therapeutic activities;Therapeutic exercise;Balance training;Neuromuscular re-education;Patient/family education   PT Goals Acute Rehab PT Goals PT Goal Formulation: With patient Time For Goal Achievement: 05/15/12 Potential to Achieve Goals: Fair Pt will go Supine/Side to Sit: with min assist;with HOB 0 degrees PT Goal: Supine/Side to Sit - Progress: Goal set today Pt will Sit at Edge of Bed: with supervision;3-5 min;with no upper extremity support PT Goal: Sit at Edge Of Bed - Progress: Goal set today Pt will go Sit to Supine/Side: with min assist;with HOB 0 degrees PT Goal: Sit to Supine/Side - Progress: Goal set today Pt will go Sit to Stand: with min assist;with upper extremity assist PT Goal: Sit to Stand -  Progress: Goal set today Pt will go Stand to Sit: with min assist;with upper  extremity assist PT Goal: Stand to Sit - Progress: Goal set today Pt will Transfer Bed to Chair/Chair to Bed: with min assist PT Transfer Goal: Bed to Chair/Chair to Bed - Progress: Goal set today Pt will Ambulate: 16 - 50 feet;with supervision;with rolling walker PT Goal: Ambulate - Progress: Goal set today  Visit Information  Last PT Received On: 05/01/12 Assistance Needed: +2    Subjective Data  Subjective: agree to eval  Patient Stated Goal: none stated.     Prior Functioning  Home Living Lives With: Alone Available Help at Discharge: Skilled Nursing Facility Prior Function Level of Independence: Independent Communication Communication: No difficulties    Cognition  Overall Cognitive Status: Appears within functional limits for tasks assessed/performed    Extremity/Trunk Assessment Right Upper Extremity Assessment RUE ROM/Strength/Tone: Within functional levels Left Upper Extremity Assessment LUE ROM/Strength/Tone: Within functional levels Right Lower Extremity Assessment RLE ROM/Strength/Tone: Within functional levels Left Lower Extremity Assessment LLE ROM/Strength/Tone: Due to pain;Due to precautions;Unable to fully assess   Balance Balance Balance Assessed: Yes Static Sitting Balance Static Sitting - Balance Support: Bilateral upper extremity supported Static Sitting - Level of Assistance: 3: Mod assist Static Sitting - Comment/# of Minutes: 2 minutes sitting on EOB with assist to prevent posterior lean. Pt leaning to the Right to avoid L hip flexion and WB on L hip.  End of Session PT - End of Session Equipment Utilized During Treatment: Gait belt Activity Tolerance: Patient limited by pain Patient left: Other (comment) (in bathroom with nursing. ) Nurse Communication: Mobility status;Weight bearing status (RN present for session. )  GP     Alferd Apa 05/01/2012, 1:22 PM  Mohannad Olivero L. Fremon Zacharia DPT [phone]

## 2012-05-01 NOTE — Progress Notes (Signed)
CARE MANAGEMENT NOTE 05/01/2012  Patient:  Kristina Browning, Kristina Browning   Account Number:  [id number]  Date Initiated:  05/01/2012  Documentation initiated by:  Vance Peper  Subjective/Objective Assessment:   78 yr old female s/p left hip arthroplasty secondary to a fall.     Action/Plan:   Patient's son's have spoken with social worker-Donna Crowder-concerning SNF for shortterm rehab.   Anticipated DC Date:  05/03/2012   Anticipated DC Plan:  SKILLED NURSING FACILITY  In-house referral  Clinical Social Worker      DC Planning Services  CM consult      Choice offered to / List presented to:             Status of service:  Completed, signed off Medicare Important Message given?   (If response is "NO", the following Medicare IM given date fields will be blank) Date Medicare IM given:   Date Additional Medicare IM given:    Discharge Disposition:  SKILLED NURSING FACILITY  Per UR Regulation:    If discussed at Long Length of Stay Meetings, dates discussed:    Comments:

## 2012-05-01 NOTE — Progress Notes (Signed)
Utilization review completed. Gladyce Mcray, RN, BSN. 

## 2012-05-01 NOTE — Progress Notes (Signed)
Pt BP at this time is 96/42 P 86. Notified Dr Susie Cassette. New order to give 250cc NS bolus and D/C Toprol. Will continue to monitor BP.

## 2012-05-01 NOTE — Progress Notes (Signed)
ANTICOAGULATION CONSULT NOTE - Initial Consult  Pharmacy Consult for Coumadin Indication: VTE prophylaxis  Allergies  Allergen Reactions  . Sulfonamide Derivatives Hives    unsure  . Penicillins Rash    Patient Measurements:   Weight: 64 kg (07/2011)  Vital Signs: Temp: 97.8 F (36.6 C) (10/10 0047) Temp src: Oral (10/09 2109) BP: 145/88 mmHg (10/10 0047) Pulse Rate: 108  (10/10 0047)  Labs:  Basename 04/30/12 2109 04/30/12 1502 04/30/12 1315  HGB -- -- 15.5*  HCT -- -- 45.3  PLT -- -- 244  APTT -- -- --  LABPROT -- -- --  INR -- -- --  HEPARINUNFRC -- -- --  CREATININE -- -- 1.01  CKTOTAL -- -- --  CKMB -- -- --  TROPONINI <0.30 <0.30 --    The CrCl is unknown because both a height and weight (above a minimum accepted value) are required for this calculation.   Medical History: Past Medical History  Diagnosis Date  . Dementia   . Thyroid disease     Medications:  Prescriptions prior to admission  Medication Sig Dispense Refill  . latanoprost (XALATAN) 0.005 % ophthalmic solution Place 1 drop into both eyes at bedtime.      Marland Kitchen levothyroxine (SYNTHROID, LEVOTHROID) 75 MCG tablet Take 1 tablet (75 mcg total) by mouth daily.  30 tablet  11  . Multiple Vitamins-Minerals (CENTRUM SILVER PO) Take 1 tablet by mouth daily.        . citalopram (CELEXA) 10 MG tablet Take 5 mg by mouth every other day.        Assessment: 78 yo female s/p hip fracture/repair for VTe prophylaxis  Goal of Therapy:  INR 1.5-2.0 Monitor platelets by anticoagulation protocol: Yes   Plan:  Coumadin 4 mg daily F/U daily INR  Jmya Uliano, Gary Fleet 05/01/2012,1:06 AM

## 2012-05-01 NOTE — Anesthesia Postprocedure Evaluation (Signed)
  Anesthesia Post-op Note  Patient: Kristina Browning  Procedure(s) Performed: Procedure(s) (LRB) with comments: ARTHROPLASTY BIPOLAR HIP (Left)  Patient Location: PACU  Anesthesia Type: General  Level of Consciousness: awake and alert   Airway and Oxygen Therapy: Patient Spontanous Breathing and Patient connected to nasal cannula oxygen  Post-op Pain: mild  Post-op Assessment: Post-op Vital signs reviewed and Patient's Cardiovascular Status Stable  Post-op Vital Signs: stable  Complications: No apparent anesthesia complications

## 2012-05-01 NOTE — Progress Notes (Signed)
Subjective: 1 Day Post-Op Procedure(s) (LRB): ARTHROPLASTY BIPOLAR HIP (Left) Patient reports pain as 3 on 0-10 scale.   Taking by mouth fluids without difficulty.  Objective: Vital signs in last 24 hours: Temp:  [96.8 F (36 C)-98.6 F (37 C)] 97.7 F (36.5 C) (10/10 0534) Pulse Rate:  [78-108] 84  (10/10 0534) Resp:  [11-27] 18  (10/10 0800) BP: (106-147)/(57-105) 146/61 mmHg (10/10 0534) SpO2:  [93 %-100 %] 100 % (10/10 0800)  Intake/Output from previous day: 10/09 0701 - 10/10 0700 In: 1428.8 [I.V.:1428.8] Out: 675 [Urine:425; Blood:250] Intake/Output this shift:     [facility] 05/01/12 0312 04/30/12 1315  HGB 13.3 15.5*    Basename 05/01/12 0312 04/30/12 1315  WBC 18.8* 14.0*  RBC 4.07 4.77  HCT 38.9 45.3  PLT 220 244    Basename 05/01/12 0312 04/30/12 1315  NA 137 138  K 4.3 3.8  CL 106 104  CO2 23 25  BUN 16 16  CREATININE 1.01 1.01  GLUCOSE 144* 108*  CALCIUM 8.9 9.5    Basename 05/01/12 0515  LABPT --  INR 1.10  left hip exam: Left hip dressing is clean, dry.  NV is intact to left lower extremity.  Knee immobilizer is intact.  Calf is soft and nontender.  Patient is alert, with very mild confusion.  This is probably her baseline.  Postop x-rays of the pelvis and left hip showed good position of the prosthesis.  Assessment/Plan: 1 Day Post-Op Procedure(s) (LRB): ARTHROPLASTY BIPOLAR HIP (Left) Plan: Up with therapy. Weight-bear as tolerated on the left with posterior hip precautions.  Wear knee immobilizer when in bed and when up. Low dose Coumadin x1 month postop. Shoot  for INR of 1.5-2.0. We will follow this patient with you. Should probably be ready for discharge to SNF by Saturday.   Kristina Browning G 05/01/2012, 8:53 AM

## 2012-05-01 NOTE — Progress Notes (Signed)
[facility] PROGRESS NOTE  Kristina Browning EXB:[id number] DOB: 09-03-1925 DOA: 04/30/2012 PCP: Ruthe Mannan, MD  Assessment/Plan: Principal Problem:  *Fracture of femoral neck, left Active Problems:  Unspecified hypothyroidism  HIP PAIN, LEFT  Memory loss  Fall  Femur fracture, left  Abnormal finding on EKG  Leukocytosis  Pre-operative cardiovascular examination      1 left femur fracture/status post left hip arthroplasty  Preoperative cardiology clearance was obtained/ EKG does show T-wave inversions in leads 23 aVF and V5 through V6. No old EKG to compare. Call PCPs office and there was no old EKG on file noted. We'll cycle cardiac enzymes every 6 hours x3.  Bedside echo was obtained by cardiology, On Coumadin for postoperative anticoagulation for one month, with a goal INR of 1.5-2.0 Per orthopedics we'll be ready for discharge at the end of the week Weight-bear as tolerated on the left with posterior hip precautions. Wear knee immobilizer when in bed and when up  #2 abnormal EKG  Followed by cardiology   #3 hypothyroidism  Will check a TSH. Continue home dose of Synthroid.    #4 leukocytosis  Likely reactive secondary to problem #1. Chest x-ray is negative. Urinalysis is negative. No need for antibiotics at this time. We'll follow for now.    #5 memory loss/dementia  Monitor for now. We'll place on Ativan as needed for agitation.    #6 prophylaxis  SCDs for DVT prophylaxis. Postoperatively will defer her DVT prophylaxis to orthopedics   Code Status: full Family Communication: family updated about patient's clinical progress Disposition Plan:  As above    Brief narrative: Kristina Browning is a 78 y.o. female with history of memory loss, worsening dementia, hypothyroidism who presents to the ED after a fall on the day of admission. Patient has some the history and per patient and sons patient was in the kitchen eating breakfast when she got up and  subsequently fell on her left side. Patient denies any syncopal episode. Patient denies any fever, no chills, no chest pain, no shortness of breath, no diarrhea, no constipation, no nausea, no vomiting, no weakness, no cough, no other associated symptoms. Patient did have significant pain in her left hip and subsequently called her sons was subsequently brought her to the ED. Per patient's son's patient has had some episodes of dizziness whenever she ate sausage in the mornings and when she stood up with subsequent issues of almost falling and being caught. Patient and sons denies any cardiac history. No of other associated symptoms. Patient was seen in the ED EKG which was done did show some T wave inversions in leads 2,3 aVF as well as V5 and V6. CBC done did have a white count of 14,000. Basic metabolic profile which was done was unremarkable. Urinalysis which was done was negative. Chest x-ray which was done was negative. Plain films of the left hip which was done did show an acute basicervical proximal left femur fracture. ED physician states he spoke to Dr. Luiz Blare of orthopedics and we were called to admit the patient for further evaluation and management.      Consultants: Dolores Patty, MD Harvie Junior, MD   Procedures: ARTHROPLASTY BIPOLAR HIP (Left   Antibiotics:  None  HPI/Subjective: Doing well hemodynamically stable overnight  Objective: Filed Vitals:   05/01/12 0047 05/01/12 0156 05/01/12 0534 05/01/12 0800  BP: 145/88 146/60 146/61   Pulse: 108 80 84   Temp: 97.8 F (36.6 C) 98.6 F (37 C) 97.7 F (36.5  C)   TempSrc:      Resp: 18 18 20 18   SpO2: 97% 100% 100% 100%    Intake/Output Summary (Last 24 hours) at 05/01/12 0931 Last data filed at 05/01/12 0700  Gross per 24 hour  Intake 1428.75 ml  Output    675 ml  Net 753.75 ml    Exam:  General: Well-developed well-nourished in no acute cardiopulmonary distress.  Eyes: Pupils equal round and reactive to  light and accommodation. Extraocular movements intact.  ENT: Oropharynx is clear, no lesions, no exudates.  Neck: Supple with no lymphadenopathy.  Cardiovascular: Regular rate rhythm no murmurs rubs or gallops. No JVD. No lower extremity edema.  Respiratory: Clear to auscultation in the anterior lung fields. No wheezing, no rhonchi, no rales.  Abdomen: Soft, nontender, nondistended, positive bowel sounds.  Skin: No lesions no rashes  Musculoskeletal: 5 out of 5 bilateral upper extremity strength. Left lower extremity is externally rotated.  Psychiatric: Alert. Normal mood. Normal affect. Poor to fair insight. Poor to fair judgment.  Neurologic: Alert. Cranial nerves II through XII are grossly intact. No focal deficits.  Lower extremities: No clubbing cyanosis or edema. Left lower extremity is externally rotated.    Data Reviewed: Basic Metabolic Panel:  Lab 05/01/12 1610 04/30/12 1315  NA 137 138  K 4.3 3.8  CL 106 104  CO2 23 25  GLUCOSE 144* 108*  BUN 16 16  CREATININE 1.01 1.01  CALCIUM 8.9 9.5  MG -- --  PHOS -- --    Liver Function Tests: No results found for this basename: AST:5,ALT:5,ALKPHOS:5,BILITOT:5,PROT:5,ALBUMIN:5 in the last 168 hours No results found for this basename: LIPASE:5,AMYLASE:5 in the last 168 hours No results found for this basename: AMMONIA:5 in the last 168 hours  CBC:  Lab 05/01/12 0312 04/30/12 1315  WBC 18.8* 14.0*  NEUTROABS -- 11.2*  HGB 13.3 15.5*  HCT 38.9 45.3  MCV 95.6 95.0  PLT 220 244    Cardiac Enzymes:  Lab 05/01/12 0312 04/30/12 2109 04/30/12 1502  CKTOTAL -- -- --  CKMB -- -- --  CKMBINDEX -- -- --  TROPONINI <0.30 <0.30 <0.30   BNP (last 3 results) No results found for this basename: PROBNP:3 in the last 8760 hours   CBG: No results found for this basename: GLUCAP:5 in the last 168 hours  No results found for this or any previous visit (from the past 240 hour(s)).   Studies: Dg Chest 1 View  04/30/2012   *RADIOLOGY REPORT*  Clinical Data: Fall with left hip deformity.  CHEST - 1 VIEW  Comparison: CT chest 08/30/2011  Findings: Trachea is midline.  Heart size normal.  Biapical pleural parenchymal scarring.  Probable mild scarring at the lung bases versus mild fibrosis.  No pleural fluid.  IMPRESSION: No acute findings.  Bibasilar scarring versus mild fibrosis.   Original Report Authenticated By: Reyes Ivan, M.D.    Dg Hip Complete Left  04/30/2012  *RADIOLOGY REPORT*  Clinical Data: Fall with deformity of the left hip  LEFT HIP - COMPLETE 2+ VIEW  Comparison: None.  Findings: There is an acute basicervical fracture of the proximal left femur.  There is impaction and varus angulation of the fracture fragments.  The femoral head is located.   The right hip is unremarkable.  No significant degenerative changes of either hip, for patient age.  No acute pelvic fracture is identified.  Sacroiliac joints and pubic symphysis appear normal.  IMPRESSION: Acute basicervical proximal left femur fracture.  Per  CMS PQRS reporting requirements (PQRS Measure 24): Given the patient's age of greater than 50 and the fracture site (hip, distal radius, or spine), the patient should be tested for osteoporosis using DXA, and the appropriate treatment considered based on the DXA results.   Original Report Authenticated By: Britta Mccreedy, M.D.    Dg Pelvis Portable  05/01/2012  *RADIOLOGY REPORT*  Clinical Data: 78 year old female status post left hip surgery.  PORTABLE PELVIS  Comparison: Preoperative study 04/30/2012.  Findings: Portable AP view 0017 hours.  Left femoral arthroplasty now in place.  Femoral head component appears normally located with respect the acetabulum.  The entire left femoral stem is not included.  Overlying postoperative changes to the soft tissues including skin staples.  No unexpected osseous changes.  IMPRESSION: Proximal left femoral arthroplasty with no adverse features.   Original Report  Authenticated By: Harley Hallmark, M.D.    Dg Hip Portable 1 View Left  05/01/2012  *RADIOLOGY REPORT*  Clinical Data: 78 year old female status post left hip surgery.  PORTABLE LEFT HIP - 1 VIEW  Comparison: 0017 hours the same day and earlier.  Findings: Portable cross-table lateral view at 0024 hours.  Pelvis osseous structures are somewhat over penetrated.  Left femoral hardware appears intact.  Grossly normal alignment.  IMPRESSION: Suboptimal radiographic technique.  Grossly normal alignment status post left proximal femur arthroplasty.   Original Report Authenticated By: Harley Hallmark, M.D.     Scheduled Meds:   .  ceFAZolin (ANCEF) IV  2 g Intravenous 60 min Pre-Op  .  ceFAZolin (ANCEF) IV  2 g Intravenous Q6H  . citalopram  5 mg Oral QODAY  . coumadin book   Does not apply Once  . fentaNYL  100 mcg Intravenous Once  . ferrous sulfate  325 mg Oral BID WC  . latanoprost  1 drop Both Eyes QHS  . levothyroxine  75 mcg Oral Daily  . metoprolol tartrate  12.5 mg Oral BID  . morphine      . ondansetron (ZOFRAN) IV  4 mg Intravenous Once  . warfarin  4 mg Oral q1800  . warfarin   Does not apply Once  . Warfarin - Pharmacist Dosing Inpatient   Does not apply q1800   Continuous Infusions:   . sodium chloride 75 mL/hr at 05/01/12 0117  . DISCONTD: sodium chloride 125 mL/hr at 04/30/12 1247    Principal Problem:  *Fracture of femoral neck, left Active Problems:  Unspecified hypothyroidism  HIP PAIN, LEFT  Memory loss  Fall  Femur fracture, left  Abnormal finding on EKG  Leukocytosis  Pre-operative cardiovascular examination    Time spent: 40 minutes   [facility]  [facility] Pager [phone]. If 8PM-8AM, please contact night-coverage at [email or web address], password [facility] 05/01/2012, 9:31 AM  LOS: 1 day

## 2012-05-01 NOTE — Op Note (Signed)
NAME:  Kristina Browning, Kristina Browning NO.:  [id number]  MEDICAL RECORD NO.:  [id number]  LOCATION:  [id number]                        FACILITY:  [facility]  PHYSICIAN:  Harvie Junior, M.D.   DATE OF BIRTH:  03/17/1926  DATE OF PROCEDURE:  04/30/2012 DATE OF DISCHARGE:                              OPERATIVE REPORT   PREOPERATIVE DIAGNOSIS:  Fractured left hip with femoral neck fracture. Left hip with varus angulation.  POSTOPERATIVE DIAGNOSIS:  Fractured left hip with femoral neck fracture. Left hip with varus angulation.  PROCEDURE:  Left hemiarthroplasty with a [employer] Basic cemented #5 stem with a regular offset and a +0 ball, size 48.  SURGEON:  Harvie Junior, M.D.  ASSISTANT:  Marshia Ly, P.A.  ANESTHESIA:  General.  BRIEF HISTORY:  Ms. Hutt is an 78 year old female with a history of having had a fall onto her left hip.  She suffered a femoral neck fracture.  She was evaluated in the emergency room, cleared by Medicine and Cardiology, and her x-ray showed she had a varus angulated femoral neck fracture.  She needs hemiarthroplasty.  Once cleared, she was taken to the operating room for left hemiarthroplasty.  PROCEDURE:  The patient was taken to the operating room.  After adequate anesthesia was obtained with general anesthetic, the patient was placed supine on the operating table.  Left hip was then prepped and draped in sterile fashion.  After she was moved in the right lateral decubitus position, all bony prominences were then well padded.  Following this, an incision was made for a posterior approach to the hip after routine prep and drape and the subcutaneous tissue down the level of the tensor fascia was divided in line with its fibers and Charnley retractor was put in place after the gluteus maximus was finger fractured.  Attention was turned to the posterior aspect of the hip where retractors were put in place.  The piriformis, short external rotators, and  posterior capsule were taken down as a group and tagged.  Attention was then turned towards making a provisional neck cut after retractors were placed.  Once this was done, the head was removed with a corkscrew and sized to 48.  Once this was done, a 48 trial was used, gave excellent fit, fill, and rotation.  Attention then turned towards the stem side where a cookie cutter was used, followed by the canal finder followed by sequential rasping, cut up to 5 and was able to countersink some, 6 really was getting a sound change and not really getting near all the way down.  At that point, we decided not to press it, it was cemented 5. At this point, the cement restrictor was sized and opened as a 4 and this was put in place allowing enough room for the stem centralizer. The cement was then mixed on the back table and a size 5 stem was cemented into an irrigated and dried canal.  Once the cement was allowed to harden, we retested the +0 ball.  Excellent range of motion, fit, still, and stability.  +0 ball final was opened and this was put in place.  Short external rotators, piriformis were then reattached to the posterior intertrochanteric  line through drill holes and the wound was then again irrigated, suctioned dry, closed in layers with the tensor fascia closed with 1-Vicryl, skin with 0 and 2-0 Vicryl and skin staples.  Sterile compressive dressing was applied as well as knee immobilizer.  The patient was taken to the recovery room and was noted to be in satisfactory condition.  Estimated blood loss for the procedure was 250 mL.     Harvie Junior, M.D.     Ranae Plumber  D:  04/30/2012  T:  05/01/2012  Job:  [id number]

## 2012-05-02 ENCOUNTER — Inpatient Hospital Stay (HOSPITAL_COMMUNITY): Payer: Medicare Other

## 2012-05-02 ENCOUNTER — Encounter (HOSPITAL_COMMUNITY): Payer: Self-pay | Admitting: Orthopedic Surgery

## 2012-05-02 LAB — CBC WITH DIFFERENTIAL/PLATELET
Basophils Absolute: 0 10*3/uL (ref 0.0–0.1)
Eosinophils Relative: 0 % (ref 0–5)
HCT: 30.9 % — ABNORMAL LOW (ref 36.0–46.0)
Hemoglobin: 10.9 g/dL — ABNORMAL LOW (ref 12.0–15.0)
Lymphocytes Relative: 16 % (ref 12–46)
MCHC: 35.3 g/dL (ref 30.0–36.0)
MCV: 95.1 fL (ref 78.0–100.0)
Monocytes Absolute: 2.1 10*3/uL — ABNORMAL HIGH (ref 0.1–1.0)
Monocytes Relative: 13 % — ABNORMAL HIGH (ref 3–12)
Neutro Abs: 10.9 10*3/uL — ABNORMAL HIGH (ref 1.7–7.7)
RDW: 14.4 % (ref 11.5–15.5)
WBC: 15.5 10*3/uL — ABNORMAL HIGH (ref 4.0–10.5)

## 2012-05-02 LAB — URINE CULTURE
Colony Count: NO GROWTH
Culture: NO GROWTH

## 2012-05-02 MED ORDER — WARFARIN SODIUM 1 MG PO TABS
1.0000 mg | ORAL_TABLET | Freq: Once | ORAL | Status: AC
  Administered 2012-05-02: 1 mg via ORAL
  Filled 2012-05-02 (×2): qty 1

## 2012-05-02 MED ORDER — WHITE PETROLATUM GEL
Status: AC
  Administered 2012-05-02
  Filled 2012-05-02: qty 5

## 2012-05-02 MED ORDER — ALUM & MAG HYDROXIDE-SIMETH 200-200-20 MG/5ML PO SUSP
30.0000 mL | ORAL | Status: DC | PRN
  Administered 2012-05-02: 30 mL via ORAL
  Filled 2012-05-02 (×4): qty 30

## 2012-05-02 NOTE — Progress Notes (Signed)
Physical Therapy Treatment Patient Details Name: Kristina Browning MRN: [id number] DOB: 1926-02-25 Today's Date: 05/02/2012 Time: 9629-5284 PT Time Calculation (min): 42 min  PT Assessment / Plan / Recommendation Comments on Treatment Session  Pt continues to struggle with mobility secondary to pain. Pt attempted to void on bedside commode unsuccessfully.  Pt having loose stools during session.  Pt was unaware of bowel movements.     Follow Up Recommendations  Post acute inpatient;Supervision/Assistance - 24 hour     Does the patient have the potential to tolerate intense rehabilitation  No, Recommend SNF  Barriers to Discharge        Equipment Recommendations  None recommended by PT    Recommendations for Other Services OT consult  Frequency 7X/week   Plan Discharge plan remains appropriate;Frequency remains appropriate    Precautions / Restrictions Precautions Precautions: Posterior Hip Precaution Booklet Issued: Yes (comment) Precaution Comments: Educated pt and family on posterior hip precautions.  Required Braces or Orthoses: Knee Immobilizer - Left Knee Immobilizer - Left: On at all times Restrictions Weight Bearing Restrictions: Yes LLE Weight Bearing: Weight bearing as tolerated   Pertinent Vitals/Pain Pt c/o severe pain throughout session with any movement of L Hip. Reviewed relaxation techniques.  Pt was medicated 1/2 hour prior to PT.      Mobility  Bed Mobility Bed Mobility: Supine to Sit;Sitting - Scoot to Edge of Bed Supine to Sit: 1: +2 Total assist Supine to Sit: Patient Percentage: 20% Sitting - Scoot to Edge of Bed: 1: +2 Total assist Sit to Supine: Not Tested (comment) Details for Bed Mobility Assistance: Assist to manage bilateral LEs and trunk secondary to pain.  Pt with minimal participation.   Transfers Transfers: Sit to Stand;Stand to Sit Sit to Stand: 1: +2 Total assist;From chair/3-in-1;With armrests Sit to Stand: Patient Percentage:  20% Stand to Sit: 1: +2 Total assist;To bed;With upper extremity assist;To elevated surface Stand to Sit: Patient Percentage: 20% Details for Transfer Assistance: Assist to initiate standing from bed.  Cues for hand placement. Pt able to achieve standing from elevated bed with +2 total assist, but unable to stand from 3 in 1 despite +2 total assist.  Pt leaning back and pushing away when attempting to stand from 3 in 1.   Ambulation/Gait Ambulation/Gait Assistance: 1: +2 Total assist Ambulation/Gait: Patient Percentage: 20% Ambulation Distance (Feet): 2 Feet Assistive device: Rolling walker Ambulation/Gait Assistance Details: Total assistance to steady pt and advance bilateral LEs.  After 3rd step pt unable to advance either LE despite total assist.   Gait Pattern: Step-to pattern;Decreased stride length;Decreased hip/knee flexion - left;Decreased hip/knee flexion - right;Decreased weight shift to left;Decreased stance time - left;Narrow base of support Gait velocity: slow  Stairs: No Wheelchair Mobility Wheelchair Mobility: No    Exercises     PT Diagnosis:    PT Problem List:   PT Treatment Interventions:     PT Goals Acute Rehab PT Goals PT Goal Formulation: With patient/family Time For Goal Achievement: 05/15/12 Potential to Achieve Goals: Fair Pt will go Supine/Side to Sit: with min assist;with HOB 0 degrees PT Goal: Supine/Side to Sit - Progress: Progressing toward goal Pt will Sit at Edge of Bed: with supervision;3-5 min;with no upper extremity support PT Goal: Sit at Edge Of Bed - Progress: Progressing toward goal Pt will go Sit to Supine/Side: with min assist;with HOB 0 degrees PT Goal: Sit to Supine/Side - Progress: Progressing toward goal Pt will go Sit to Stand: with min assist;with upper extremity assist  PT Goal: Sit to Stand - Progress: Progressing toward goal Pt will go Stand to Sit: with min assist;with upper extremity assist PT Goal: Stand to Sit - Progress:  Progressing toward goal Pt will Transfer Bed to Chair/Chair to Bed: with min assist PT Transfer Goal: Bed to Chair/Chair to Bed - Progress: Progressing toward goal Pt will Ambulate: 16 - 50 feet;with supervision;with rolling walker PT Goal: Ambulate - Progress: Progressing toward goal  Visit Information  Last PT Received On: 05/02/12    Subjective Data  Subjective: It hurts so bad Patient Stated Goal: none stated.     Cognition  Overall Cognitive Status: Appears within functional limits for tasks assessed/performed Arousal/Alertness: Awake/alert Orientation Level: Appears intact for tasks assessed Behavior During Session: Anxious    Balance  Balance Balance Assessed: Yes Static Sitting Balance Static Sitting - Balance Support: Bilateral upper extremity supported Static Sitting - Level of Assistance: 3: Mod assist Static Sitting - Comment/# of Minutes: 1 min sitting on EOB. Pt presents with R Lateral lean avoiding wt on L HIp.    End of Session PT - End of Session Equipment Utilized During Treatment: Gait belt Activity Tolerance: Patient limited by pain Patient left: in bed;with call bell/phone within reach Nurse Communication: Mobility status;Weight bearing status   GP     Kristina Browning 05/02/2012, 1:00 PM Kristina Browning L. Kristina Browning DPT [phone]

## 2012-05-02 NOTE — Progress Notes (Addendum)
[facility] PROGRESS NOTE  Kristina Browning ZOX:[id number] DOB: [birth year] DOA: 04/30/2012 PCP: Ruthe Mannan, MD  Assessment/Plan: Principal Problem:  *Fracture of femoral neck, left Active Problems:  Unspecified hypothyroidism  HIP PAIN, LEFT  Memory loss  Fall  Femur fracture, left  Abnormal finding on EKG  Leukocytosis  Pre-operative cardiovascular examination  UTI (lower urinary tract infection)      1 left femur fracture/status post left hip arthroplasty  Preoperative cardiology clearance was obtained/ EKG does show T-wave inversions in leads 23 aVF and V5 through V6. No old EKG to compare. Call PCPs office and there was no old EKG on file noted. We'll cycle cardiac enzymes every 6 hours x3.  Bedside echo was obtained by cardiology,  On Coumadin for postoperative anticoagulation for one month, with a goal INR of 1.5-2.0  Per orthopedics we'll be ready for discharge at the end of the week  Weight-bear as tolerated on the left with posterior hip precautions. Wear knee immobilizer when in bed and when up   #2 abnormal EKG  Followed by cardiology   #3 hypothyroidism  Will check a TSH. Continue home dose of Synthroid.   #4 leukocytosis  Likely reactive secondary to problem #1. Chest x-ray is negative. Urinalysis is negative. No need for antibiotics at this time. We'll follow for now.   #5 memory loss/dementia  Monitor for now. We'll place on Ativan as needed for agitation.    #6 prophylaxis  SCDs for DVT prophylaxis. Postoperatively will defer her DVT prophylaxis to orthopedics   #7 acute blood loss anemia Hemoglobin is up from 15.5-10.9, we'll monitor closely  Code Status: full  Family Communication: family updated about patient's clinical progress  Disposition Plan: DC to SNF Monday, not stable due to falling hg   Brief narrative:  Kristina Browning is a 78 y.o. female with history of memory loss, worsening dementia, hypothyroidism who presents to the ED  after a fall on the day of admission. Patient has some the history and per patient and sons patient was in the kitchen eating breakfast when she got up and subsequently fell on her left side. Patient denies any syncopal episode. Patient denies any fever, no chills, no chest pain, no shortness of breath, no diarrhea, no constipation, no nausea, no vomiting, no weakness, no cough, no other associated symptoms. Patient did have significant pain in her left hip and subsequently called her sons was subsequently brought her to the ED. Per patient's son's patient has had some episodes of dizziness whenever she ate sausage in the mornings and when she stood up with subsequent issues of almost falling and being caught. Patient and sons denies any cardiac history. No of other associated symptoms. Patient was seen in the ED EKG which was done did show some T wave inversions in leads 2,3 aVF as well as V5 and V6. CBC done did have a white count of 14,000. Basic metabolic profile which was done was unremarkable. Urinalysis which was done was negative. Chest x-ray which was done was negative. Plain films of the left hip which was done did show an acute basicervical proximal left femur fracture. ED physician states he spoke to Dr. Luiz Blare of orthopedics and we were called to admit the patient for further evaluation and management.    Consultants:  Dolores Patty, MD  Harvie Junior, MD  Procedures:  ARTHROPLASTY BIPOLAR HIP (Left  Antibiotics:  None HPI/Subjective:  Doing well hemodynamically stable overnight Objective: Filed Vitals:   05/01/12 2007 05/01/12 2025  05/02/12 0000 05/02/12 0437  BP: 81/48 100/60  131/59  Pulse: 88   109  Temp: 98.5 F (36.9 C)   98.4 F (36.9 C)  TempSrc:      Resp: 18  18 18   SpO2: 95%  96% 92%    Intake/Output Summary (Last 24 hours) at 05/02/12 0916 Last data filed at 05/02/12 0502  Gross per 24 hour  Intake   1090 ml  Output    800 ml  Net    290 ml     Exam:  HENT:  Head: Atraumatic.  Nose: Nose normal.  Mouth/Throat: Oropharynx is clear and moist.  Eyes: Conjunctivae are normal. Pupils are equal, round, and reactive to light. No scleral icterus.  Neck: Neck supple. No tracheal deviation present.  Cardiovascular: Normal rate, regular rhythm, normal heart sounds and intact distal pulses.  Pulmonary/Chest: Effort normal and breath sounds normal. No respiratory distress.  Abdominal: Soft. Normal appearance and bowel sounds are normal. She exhibits no distension. There is no tenderness.  Musculoskeletal: She exhibits no edema and no tenderness.  Neurological: She is alert. No cranial nerve deficit.    Data Reviewed: Basic Metabolic Panel:  Lab 05/01/12 7829 04/30/12 1315  NA 137 138  K 4.3 3.8  CL 106 104  CO2 23 25  GLUCOSE 144* 108*  BUN 16 16  CREATININE 1.01 1.01  CALCIUM 8.9 9.5  MG -- --  PHOS -- --    Liver Function Tests: No results found for this basename: AST:5,ALT:5,ALKPHOS:5,BILITOT:5,PROT:5,ALBUMIN:5 in the last 168 hours No results found for this basename: LIPASE:5,AMYLASE:5 in the last 168 hours No results found for this basename: AMMONIA:5 in the last 168 hours  CBC:  Lab 05/02/12 0555 05/01/12 0312 04/30/12 1315  WBC 15.5* 18.8* 14.0*  NEUTROABS 10.9* -- 11.2*  HGB 10.9* 13.3 15.5*  HCT 30.9* 38.9 45.3  MCV 95.1 95.6 95.0  PLT 177 220 244    Cardiac Enzymes:  Lab 05/01/12 0312 04/30/12 2109 04/30/12 1502  CKTOTAL -- -- --  CKMB -- -- --  CKMBINDEX -- -- --  TROPONINI <0.30 <0.30 <0.30   BNP (last 3 results) No results found for this basename: PROBNP:3 in the last 8760 hours   CBG: No results found for this basename: GLUCAP:5 in the last 168 hours  Recent Results (from the past 240 hour(s))  URINE CULTURE     Status: Normal   Collection Time   04/30/12  1:46 PM      Component Value Range Status Comment   Specimen Description URINE, CATHETERIZED   Final    Special Requests NONE    Final    Culture  Setup Time 04/30/2012 14:30   Final    Colony Count NO GROWTH   Final    Culture NO GROWTH   Final    Report Status 05/01/2012 FINAL   Final   URINE CULTURE     Status: Normal   Collection Time   05/01/12  8:34 AM      Component Value Range Status Comment   Specimen Description URINE, CATHETERIZED   Final    Special Requests NONE   Final    Culture  Setup Time 05/01/2012 10:26   Final    Colony Count NO GROWTH   Final    Culture NO GROWTH   Final    Report Status 05/02/2012 FINAL   Final      Studies: Dg Chest 1 View  04/30/2012  *RADIOLOGY REPORT*  Clinical Data: Fall  with left hip deformity.  CHEST - 1 VIEW  Comparison: CT chest 08/30/2011  Findings: Trachea is midline.  Heart size normal.  Biapical pleural parenchymal scarring.  Probable mild scarring at the lung bases versus mild fibrosis.  No pleural fluid.  IMPRESSION: No acute findings.  Bibasilar scarring versus mild fibrosis.   Original Report Authenticated By: Reyes Ivan, M.D.    Dg Hip Complete Left  04/30/2012  *RADIOLOGY REPORT*  Clinical Data: Fall with deformity of the left hip  LEFT HIP - COMPLETE 2+ VIEW  Comparison: None.  Findings: There is an acute basicervical fracture of the proximal left femur.  There is impaction and varus angulation of the fracture fragments.  The femoral head is located.   The right hip is unremarkable.  No significant degenerative changes of either hip, for patient age.  No acute pelvic fracture is identified.  Sacroiliac joints and pubic symphysis appear normal.  IMPRESSION: Acute basicervical proximal left femur fracture.  Per CMS PQRS reporting requirements (PQRS Measure 24): Given the patient's age of greater than 50 and the fracture site (hip, distal radius, or spine), the patient should be tested for osteoporosis using DXA, and the appropriate treatment considered based on the DXA results.   Original Report Authenticated By: Britta Mccreedy, M.D.    Dg Pelvis  Portable  05/01/2012  *RADIOLOGY REPORT*  Clinical Data: 78 year old female status post left hip surgery.  PORTABLE PELVIS  Comparison: Preoperative study 04/30/2012.  Findings: Portable AP view 0017 hours.  Left femoral arthroplasty now in place.  Femoral head component appears normally located with respect the acetabulum.  The entire left femoral stem is not included.  Overlying postoperative changes to the soft tissues including skin staples.  No unexpected osseous changes.  IMPRESSION: Proximal left femoral arthroplasty with no adverse features.   Original Report Authenticated By: Harley Hallmark, M.D.    Dg Chest Port 1 View  05/01/2012  *RADIOLOGY REPORT*  Clinical Data: Leukocytosis.  PORTABLE CHEST - 1 VIEW  Comparison: Chest x-ray 04/30/2012.  Findings: Lung volumes are normal.  No consolidative airspace disease.  No pleural effusions.  No pneumothorax.  No pulmonary nodule or mass noted.  Pulmonary vasculature and the cardiomediastinal silhouette are within normal limits.  Bilateral apical pleuroparenchymal thickening (unchanged and most compatible with scarring).  IMPRESSION: 1.  No radiographic evidence of acute cardiopulmonary disease.   Original Report Authenticated By: Florencia Reasons, M.D.    Dg Hip Portable 1 View Left  05/01/2012  *RADIOLOGY REPORT*  Clinical Data: 78 year old female status post left hip surgery.  PORTABLE LEFT HIP - 1 VIEW  Comparison: 0017 hours the same day and earlier.  Findings: Portable cross-table lateral view at 0024 hours.  Pelvis osseous structures are somewhat over penetrated.  Left femoral hardware appears intact.  Grossly normal alignment.  IMPRESSION: Suboptimal radiographic technique.  Grossly normal alignment status post left proximal femur arthroplasty.   Original Report Authenticated By: Harley Hallmark, M.D.     Scheduled Meds:   . cefTRIAXone (ROCEPHIN)  IV  1 g Intravenous Q24H  . citalopram  5 mg Oral QODAY  . coumadin book   Does not apply  Once  . ferrous sulfate  325 mg Oral BID WC  . latanoprost  1 drop Both Eyes QHS  . levothyroxine  75 mcg Oral Daily  . sodium chloride  250 mL Intravenous Once  . warfarin  4 mg Oral q1800  . warfarin   Does not apply Once  .  Warfarin - Pharmacist Dosing Inpatient   Does not apply q1800  . DISCONTD: metoprolol tartrate  12.5 mg Oral BID   Continuous Infusions:   . sodium chloride 75 mL/hr at 05/01/12 0117    Principal Problem:  *Fracture of femoral neck, left Active Problems:  Unspecified hypothyroidism  HIP PAIN, LEFT  Memory loss  Fall  Femur fracture, left  Abnormal finding on EKG  Leukocytosis  Pre-operative cardiovascular examination  UTI (lower urinary tract infection)    Time spent: 40 minutes   [facility]  [facility] Pager [phone]. If 8PM-8AM, please contact night-coverage at [email or web address], password [facility] 05/02/2012, 9:16 AM  LOS: 2 days

## 2012-05-02 NOTE — Progress Notes (Signed)
Patient ID: Kristina Browning, female   DOB: 1925-12-20, 78 y.o.   MRN: [id number] Subjective: 2 Days Post-Op Procedure(s) (LRB): ARTHROPLASTY BIPOLAR HIP (Left) Patient reports pain as mild.    Objective: Vital signs in last 24 hours: Temp:  [98.1 F (36.7 C)-98.5 F (36.9 C)] 98.4 F (36.9 C) (10/11 0437) Pulse Rate:  [79-109] 109  (10/11 0437) Resp:  [18] 18  (10/11 0437) BP: (81-131)/(46-60) 131/59 mmHg (10/11 0437) SpO2:  [92 %-96 %] 92 % (10/11 0437)  Intake/Output from previous day: 10/10 0701 - 10/11 0700 In: 1090 [I.V.:790; IV Piggyback:300] Out: 800 [Urine:800] Intake/Output this shift:     Basename 05/02/12 0555 05/01/12 0312 04/30/12 1315  HGB 10.9* 13.3 15.5*    Basename 05/02/12 0555 05/01/12 0312  WBC 15.5* 18.8*  RBC 3.25* 4.07  HCT 30.9* 38.9  PLT 177 220    Basename 05/01/12 0312 04/30/12 1315  NA 137 138  K 4.3 3.8  CL 106 104  CO2 23 25  BUN 16 16  CREATININE 1.01 1.01  GLUCOSE 144* 108*  CALCIUM 8.9 9.5    Basename 05/02/12 0555 05/01/12 0515  LABPT -- --  INR 1.78* 1.10    Neurologically intact ABD soft Neurovascular intact Sensation intact distally Intact pulses distally Dorsiflexion/Plantar flexion intact No cellulitis present Compartment soft  Assessment/Plan: 2 Days Post-Op Procedure(s) (LRB): ARTHROPLASTY BIPOLAR HIP (Left) Advance diet Up with therapy Discharge to SNF today if begins to pee otherwise tomorrow with foley in place if fails to void today  Kapono Luhn L 05/02/2012, 10:08 AM

## 2012-05-02 NOTE — Progress Notes (Signed)
ANTICOAGULATION CONSULT NOTE - Follow up Consult  Pharmacy Consult for Coumadin Indication: VTE prophylaxis  Allergies  Allergen Reactions  . Sulfonamide Derivatives Hives    unsure  . Penicillins Rash    Patient Measurements:   Weight: 64 kg (07/2011)  Vital Signs: Temp: 98.4 F (36.9 C) (10/11 0437) BP: 131/59 mmHg (10/11 0437) Pulse Rate: 109  (10/11 0437)  Labs:  Basename 05/02/12 0555 05/01/12 0515 05/01/12 0312 04/30/12 2109 04/30/12 1502 04/30/12 1315  HGB 10.9* -- 13.3 -- -- --  HCT 30.9* -- 38.9 -- -- 45.3  PLT 177 -- 220 -- -- 244  APTT -- -- -- -- -- --  LABPROT 20.1* 14.1 -- -- -- --  INR 1.78* 1.10 -- -- -- --  HEPARINUNFRC -- -- -- -- -- --  CREATININE -- -- 1.01 -- -- 1.01  CKTOTAL -- -- -- -- -- --  CKMB -- -- -- -- -- --  TROPONINI -- -- <0.30 <0.30 <0.30 --    The CrCl is unknown because both a height and weight (above a minimum accepted value) are required for this calculation.   Assessment: 78 yo female s/p hip fracture/repair on 04/30/12 to continue on Coumadin for VTE prophylaxis. Baseline INR of 1.10. INR today is therapeutic (1.78) with lower INR goal after one dose of 4 mg last night. Larger than expected INR increase, anticipate have not seen full effect of last night's dose; will give lower dose tonight. Some decrease in hgb overnight (13.3>10.9), plt ok (220>177); no bleeding noted per RN.   Goal of Therapy:  INR 1.5-2.0 Monitor platelets by anticoagulation protocol: Yes   Plan:  Coumadin 1 mg PO x1  F/U daily INR  Crist Fat L 05/02/2012,10:37 AM

## 2012-05-02 NOTE — Progress Notes (Signed)
Active bed search remains in place- sons request [facility] if possible. Patient changed to HIPPA status per request of sons due to multiple family issues.  Will provide bed offers to sons Monday a.m. With planned d/c Monday to SNF if medically stable per MD.  Lupita Leash T. [place] Pugh  [phone]

## 2012-05-03 LAB — CBC
HCT: 30.9 % — ABNORMAL LOW (ref 36.0–46.0)
MCH: 31.7 pg (ref 26.0–34.0)
MCV: 94.2 fL (ref 78.0–100.0)
RBC: 3.28 MIL/uL — ABNORMAL LOW (ref 3.87–5.11)
RDW: 14.5 % (ref 11.5–15.5)
WBC: 11.9 10*3/uL — ABNORMAL HIGH (ref 4.0–10.5)

## 2012-05-03 MED ORDER — ALUM & MAG HYDROXIDE-SIMETH 200-200-20 MG/5ML PO SUSP
30.0000 mL | Freq: Three times a day (TID) | ORAL | Status: DC
  Administered 2012-05-03 – 2012-05-05 (×6): 30 mL via ORAL
  Filled 2012-05-03 (×6): qty 30

## 2012-05-03 MED ORDER — PANTOPRAZOLE SODIUM 40 MG PO TBEC
40.0000 mg | DELAYED_RELEASE_TABLET | Freq: Every day | ORAL | Status: DC
  Administered 2012-05-04 – 2012-05-05 (×2): 40 mg via ORAL
  Filled 2012-05-03 (×2): qty 1

## 2012-05-03 NOTE — Progress Notes (Signed)
Physical Therapy Treatment Patient Details Name: Kristina Browning MRN: [id number] DOB: 1926-03-21 Today's Date: 05/03/2012 Time: 8469-6295 PT Time Calculation (min): 24 min  PT Assessment / Plan / Recommendation Comments on Treatment Session  Patient progressing slowly with ambulation. Patient still very anxious and guarded with mobility. Requires increased encouragement    Follow Up Recommendations  Post acute inpatient;Supervision/Assistance - 24 hour     Does the patient have the potential to tolerate intense rehabilitation  No, Recommend SNF  Barriers to Discharge        Equipment Recommendations  None recommended by PT    Recommendations for Other Services    Frequency 7X/week   Plan Discharge plan remains appropriate;Frequency remains appropriate    Precautions / Restrictions Precautions Precautions: Posterior Hip Precaution Booklet Issued: Yes (comment) Precaution Comments: Educated patient and family on all hip precautions Required Braces or Orthoses: Knee Immobilizer - Left Knee Immobilizer - Left: On at all times Restrictions Weight Bearing Restrictions: Yes LLE Weight Bearing: Weight bearing as tolerated   Pertinent Vitals/Pain     Mobility  Bed Mobility Bed Mobility: Supine to Sit Supine to Sit: With rails;1: +2 Total assist;HOB elevated Supine to Sit: Patient Percentage: 30% Sitting - Scoot to Edge of Bed: 1: +2 Total assist Sitting - Scoot to Edge of Bed: Patient Percentage: 30% Details for Bed Mobility Assistance: A for LLE to EOB and to elevate shoulders into upright sitting. Cueing for upright posture with sitting EOB Transfers Sit to Stand: 1: +2 Total assist;From bed;With upper extremity assist Sit to Stand: Patient Percentage: 30% Stand to Sit: With upper extremity assist;To chair/3-in-1;1: +2 Total assist Stand to Sit: Patient Percentage: 30% Details for Transfer Assistance: A to initiate stand and to shift weight anteriorly over BOS.  Cuesing for technique. A to control descent and position LLE prior to sitting Ambulation/Gait Ambulation/Gait Assistance: 1: +2 Total assist Ambulation/Gait: Patient Percentage: 50% Ambulation Distance (Feet): 10 Feet Assistive device: Rolling walker Ambulation/Gait Assistance Details: A with all aspects of ambulation from RW placement, positioning and advancement of RW. Patient was able to advance LLE x3 Gait Pattern: Step-to pattern;Decreased step length - right;Decreased step length - left;Trunk flexed;Antalgic;Decreased stance time - left    Exercises Total Joint Exercises Ankle Circles/Pumps: AROM;Both;15 reps Quad Sets: AAROM;Left;10 reps   PT Diagnosis:    PT Problem List:   PT Treatment Interventions:     PT Goals Acute Rehab PT Goals PT Goal: Supine/Side to Sit - Progress: Progressing toward goal PT Goal: Sit at Edge Of Bed - Progress: Progressing toward goal PT Goal: Sit to Stand - Progress: Progressing toward goal PT Goal: Stand to Sit - Progress: Progressing toward goal PT Transfer Goal: Bed to Chair/Chair to Bed - Progress: Progressing toward goal PT Goal: Ambulate - Progress: Progressing toward goal  Visit Information  Last PT Received On: 05/03/12 Assistance Needed: +2    Subjective Data      Cognition  Overall Cognitive Status: Appears within functional limits for tasks assessed/performed Arousal/Alertness: Awake/alert Orientation Level: Appears intact for tasks assessed Behavior During Session: Anxious    Balance     End of Session PT - End of Session Equipment Utilized During Treatment: Gait belt;Left knee immobilizer Activity Tolerance: Patient tolerated treatment well Patient left: in chair;with call bell/phone within reach Nurse Communication: Mobility status   GP     Fredrich Birks 05/03/2012, 1:53 PM 05/03/2012 Fredrich Birks PTA [phone] pager [phone] office

## 2012-05-03 NOTE — Progress Notes (Signed)
Subjective: 3 Days Post-Op Procedure(s) (LRB): ARTHROPLASTY BIPOLAR HIP (Left)  Activity level:  Working with pt Diet tolerance:  eating Voiding:  ok Patient reports pain as 2 on 0-10 scale.    Objective: Vital signs in last 24 hours: Temp:  [97.6 F (36.4 C)-98.2 F (36.8 C)] 97.6 F (36.4 C) (10/12 0533) Pulse Rate:  [89-101] 96  (10/12 0533) Resp:  [16-20] 18  (10/12 0533) BP: (111-143)/(47-62) 143/62 mmHg (10/12 0533) SpO2:  [90 %-93 %] 90 % (10/12 0533)  Labs:  [facility] 05/03/12 0645 05/02/12 0555 05/01/12 0312 04/30/12 1315  HGB 10.4* 10.9* 13.3 15.5*    Basename 05/03/12 0645 05/02/12 0555  WBC 11.9* 15.5*  RBC 3.28* 3.25*  HCT 30.9* 30.9*  PLT 176 177    Basename 05/01/12 0312 04/30/12 1315  NA 137 138  K 4.3 3.8  CL 106 104  CO2 23 25  BUN 16 16  CREATININE 1.01 1.01  GLUCOSE 144* 108*  CALCIUM 8.9 9.5    Basename 05/03/12 0645 05/02/12 0555  LABPT -- --  INR 2.51* 1.78*    Physical Exam:  Neurologically intact ABD soft Neurovascular intact Sensation intact distally Intact pulses distally Dorsiflexion/Plantar flexion intact Incision: dressing C/D/I No cellulitis present Compartment soft  Assessment/Plan:  3 Days Post-Op Procedure(s) (LRB): ARTHROPLASTY BIPOLAR HIP (Left) Advance diet Up with therapy Discharge to SNF on Monday Cont therapy    Aloysius Heinle R 05/03/2012, 8:00 AM

## 2012-05-03 NOTE — Progress Notes (Signed)
[facility] PROGRESS NOTE  Kristina Browning ZOX:[id number] DOB: 12/14/1925 DOA: 04/30/2012 PCP: Ruthe Mannan, MD  Assessment/Plan: Principal Problem:  *Fracture of femoral neck, left Active Problems:  Unspecified hypothyroidism  HIP PAIN, LEFT  Memory loss  Fall  Femur fracture, left  Abnormal finding on EKG  Leukocytosis  Pre-operative cardiovascular examination  UTI (lower urinary tract infection)     1 left femur fracture/status post left hip arthroplasty  Preoperative cardiology clearance was obtained/ EKG does show T-wave inversions in leads 23 aVF and V5 through V6. No old EKG to compare. Call PCPs office and there was no old EKG on file noted. We'll cycle cardiac enzymes every 6 hours x3.  Bedside echo was obtained by cardiology,  On Coumadin for postoperative anticoagulation for one month, with a goal INR of 1.5-2.0  Per orthopedics we'll be ready for discharge at the end of the week  Weight-bear as tolerated on the left with posterior hip precautions. Wear knee immobilizer when in bed and when up discharge to SNF Monday   #2 abnormal EKG  Followed by cardiology  #3 hypothyroidism  Will check a TSH. Continue home dose of Synthroid.  #4 leukocytosis  Likely reactive secondary to problem #1. Chest x-ray is negative. Urinalysis is negative. No need for antibiotics at this time. We'll follow for now.  #5 memory loss/dementia  Monitor for now. We'll place on Ativan as needed for agitation.  #6 prophylaxis  SCDs for DVT prophylaxis. Postoperatively will defer her DVT prophylaxis to orthopedics  #7 acute blood loss anemia  Hemoglobin is up from 15.5-10.9, we'll monitor closely , CT scan done to rule out retroperitoneal bleed which was negative, don't see any active bleeding therefore continue anticoagulation   Code Status: full  Family Communication: family updated about patient's clinical progress  Disposition Plan: DC to SNF Monday, not stable due to falling hg     Brief narrative:  Kristina Browning is a 78 y.o. female with history of memory loss, worsening dementia, hypothyroidism who presents to the ED after a fall on the day of admission. Patient has some the history and per patient and sons patient was in the kitchen eating breakfast when she got up and subsequently fell on her left side. Patient denies any syncopal episode. Patient denies any fever, no chills, no chest pain, no shortness of breath, no diarrhea, no constipation, no nausea, no vomiting, no weakness, no cough, no other associated symptoms. Patient did have significant pain in her left hip and subsequently called her sons was subsequently brought her to the ED. Per patient's son's patient has had some episodes of dizziness whenever she ate sausage in the mornings and when she stood up with subsequent issues of almost falling and being caught. Patient and sons denies any cardiac history. No of other associated symptoms. Patient was seen in the ED EKG which was done did show some T wave inversions in leads 2,3 aVF as well as V5 and V6. CBC done did have a white count of 14,000. Basic metabolic profile which was done was unremarkable. Urinalysis which was done was negative. Chest x-ray which was done was negative. Plain films of the left hip which was done did show an acute basicervical proximal left femur fracture. ED physician states he spoke to Dr. Luiz Blare of orthopedics and we were called to admit the patient for further evaluation and management.    Consultants:  Dolores Patty, MD  Harvie Junior, MD  Procedures:  ARTHROPLASTY BIPOLAR HIP (Left  Antibiotics:  None HPI/Subjective:  Doing well hemodynamically stable overnight  Objective: Filed Vitals:   05/03/12 0000 05/03/12 0400 05/03/12 0533 05/03/12 0800  BP:   143/62   Pulse:   96   Temp:   97.6 F (36.4 C)   TempSrc:      Resp: 16 16 18 16   SpO2:   90% 100%    Intake/Output Summary (Last 24 hours) at 05/03/12  0907 Last data filed at 05/03/12 0555  Gross per 24 hour  Intake 1058.75 ml  Output    175 ml  Net 883.75 ml    Exam:  HENT:  Head: Atraumatic.  Nose: Nose normal.  Mouth/Throat: Oropharynx is clear and moist.  Eyes: Conjunctivae are normal. Pupils are equal, round, and reactive to light. No scleral icterus.  Neck: Neck supple. No tracheal deviation present.  Cardiovascular: Normal rate, regular rhythm, normal heart sounds and intact distal pulses.  Pulmonary/Chest: Effort normal and breath sounds normal. No respiratory distress.  Abdominal: Soft. Normal appearance and bowel sounds are normal. She exhibits no distension. There is no tenderness.  Musculoskeletal: She exhibits no edema and no tenderness.  Neurological: She is alert. No cranial nerve deficit.    Data Reviewed: Basic Metabolic Panel:  Lab 05/01/12 4098 04/30/12 1315  NA 137 138  K 4.3 3.8  CL 106 104  CO2 23 25  GLUCOSE 144* 108*  BUN 16 16  CREATININE 1.01 1.01  CALCIUM 8.9 9.5  MG -- --  PHOS -- --    Liver Function Tests: No results found for this basename: AST:5,ALT:5,ALKPHOS:5,BILITOT:5,PROT:5,ALBUMIN:5 in the last 168 hours No results found for this basename: LIPASE:5,AMYLASE:5 in the last 168 hours No results found for this basename: AMMONIA:5 in the last 168 hours  CBC:  Lab 05/03/12 0645 05/02/12 0555 05/01/12 0312 04/30/12 1315  WBC 11.9* 15.5* 18.8* 14.0*  NEUTROABS -- 10.9* -- 11.2*  HGB 10.4* 10.9* 13.3 15.5*  HCT 30.9* 30.9* 38.9 45.3  MCV 94.2 95.1 95.6 95.0  PLT 176 177 220 244    Cardiac Enzymes:  Lab 05/01/12 0312 04/30/12 2109 04/30/12 1502  CKTOTAL -- -- --  CKMB -- -- --  CKMBINDEX -- -- --  TROPONINI <0.30 <0.30 <0.30   BNP (last 3 results) No results found for this basename: PROBNP:3 in the last 8760 hours   CBG: No results found for this basename: GLUCAP:5 in the last 168 hours  Recent Results (from the past 240 hour(s))  URINE CULTURE     Status: Normal    Collection Time   04/30/12  1:46 PM      Component Value Range Status Comment   Specimen Description URINE, CATHETERIZED   Final    Special Requests NONE   Final    Culture  Setup Time 04/30/2012 14:30   Final    Colony Count NO GROWTH   Final    Culture NO GROWTH   Final    Report Status 05/01/2012 FINAL   Final   URINE CULTURE     Status: Normal   Collection Time   05/01/12  8:34 AM      Component Value Range Status Comment   Specimen Description URINE, CATHETERIZED   Final    Special Requests NONE   Final    Culture  Setup Time 05/01/2012 10:26   Final    Colony Count NO GROWTH   Final    Culture NO GROWTH   Final    Report Status 05/02/2012 FINAL  Final      Studies: Ct Abdomen Pelvis Wo Contrast  05/02/2012  *RADIOLOGY REPORT*  Clinical Data: Patient status post left hip arthroplasty on 04/30/2012.  CT ABDOMEN AND PELVIS WITHOUT CONTRAST  Technique:  Multidetector CT imaging of the abdomen and pelvis was performed following the standard protocol without intravenous contrast.  Comparison: None.  Findings: Lung bases are clear.  No pericardial fluid.  No focal hepatic lesion.  The gallbladder, pancreas, spleen, adrenal glands are normal.  Kidneys appear normal.  The stomach is normal.  Hiatal hernia is present.  There is no evidence of bowel obstruction.  No evidence of abscess in the abdomen or pelvis.  There is gas within  bladder presumably related to recent catheterization.  There is  small amount gas in the muscle planes of the left hip from recent arthroplasty.  There is no organized fluid collection. There is no evidence of retroperitoneal hematoma or abscess.  IMPRESSION:  1.  No evidence retroperitoneal hematoma. 2.  No evidence of abscess in the pelvis.  3.  Gas within the tissue planes of the left hip related to recent arthroplasty.  No complication evident. 4.  Gas within bladder presumably related to recent catheterization.   Original Report Authenticated By: Genevive Bi,  M.D.    Dg Chest 1 View  04/30/2012  *RADIOLOGY REPORT*  Clinical Data: Fall with left hip deformity.  CHEST - 1 VIEW  Comparison: CT chest 08/30/2011  Findings: Trachea is midline.  Heart size normal.  Biapical pleural parenchymal scarring.  Probable mild scarring at the lung bases versus mild fibrosis.  No pleural fluid.  IMPRESSION: No acute findings.  Bibasilar scarring versus mild fibrosis.   Original Report Authenticated By: Reyes Ivan, M.D.    Dg Hip Complete Left  04/30/2012  *RADIOLOGY REPORT*  Clinical Data: Fall with deformity of the left hip  LEFT HIP - COMPLETE 2+ VIEW  Comparison: None.  Findings: There is an acute basicervical fracture of the proximal left femur.  There is impaction and varus angulation of the fracture fragments.  The femoral head is located.   The right hip is unremarkable.  No significant degenerative changes of either hip, for patient age.  No acute pelvic fracture is identified.  Sacroiliac joints and pubic symphysis appear normal.  IMPRESSION: Acute basicervical proximal left femur fracture.  Per CMS PQRS reporting requirements (PQRS Measure 24): Given the patient's age of greater than 50 and the fracture site (hip, distal radius, or spine), the patient should be tested for osteoporosis using DXA, and the appropriate treatment considered based on the DXA results.   Original Report Authenticated By: Britta Mccreedy, M.D.    Dg Pelvis Portable  05/01/2012  *RADIOLOGY REPORT*  Clinical Data: 78 year old female status post left hip surgery.  PORTABLE PELVIS  Comparison: Preoperative study 04/30/2012.  Findings: Portable AP view 0017 hours.  Left femoral arthroplasty now in place.  Femoral head component appears normally located with respect the acetabulum.  The entire left femoral stem is not included.  Overlying postoperative changes to the soft tissues including skin staples.  No unexpected osseous changes.  IMPRESSION: Proximal left femoral arthroplasty with no  adverse features.   Original Report Authenticated By: Harley Hallmark, M.D.    Dg Chest Port 1 View  05/01/2012  *RADIOLOGY REPORT*  Clinical Data: Leukocytosis.  PORTABLE CHEST - 1 VIEW  Comparison: Chest x-ray 04/30/2012.  Findings: Lung volumes are normal.  No consolidative airspace disease.  No pleural effusions.  No  pneumothorax.  No pulmonary nodule or mass noted.  Pulmonary vasculature and the cardiomediastinal silhouette are within normal limits.  Bilateral apical pleuroparenchymal thickening (unchanged and most compatible with scarring).  IMPRESSION: 1.  No radiographic evidence of acute cardiopulmonary disease.   Original Report Authenticated By: Florencia Reasons, M.D.    Dg Hip Portable 1 View Left  05/01/2012  *RADIOLOGY REPORT*  Clinical Data: 78 year old female status post left hip surgery.  PORTABLE LEFT HIP - 1 VIEW  Comparison: 0017 hours the same day and earlier.  Findings: Portable cross-table lateral view at 0024 hours.  Pelvis osseous structures are somewhat over penetrated.  Left femoral hardware appears intact.  Grossly normal alignment.  IMPRESSION: Suboptimal radiographic technique.  Grossly normal alignment status post left proximal femur arthroplasty.   Original Report Authenticated By: Harley Hallmark, M.D.     Scheduled Meds:   . cefTRIAXone (ROCEPHIN)  IV  1 g Intravenous Q24H  . citalopram  5 mg Oral QODAY  . coumadin book   Does not apply Once  . ferrous sulfate  325 mg Oral BID WC  . latanoprost  1 drop Both Eyes QHS  . levothyroxine  75 mcg Oral Daily  . warfarin  1 mg Oral ONCE-1800  . warfarin   Does not apply Once  . Warfarin - Pharmacist Dosing Inpatient   Does not apply q1800  . white petrolatum      . DISCONTD: warfarin  4 mg Oral q1800   Continuous Infusions:   . sodium chloride 75 mL/hr at 05/02/12 1900    Principal Problem:  *Fracture of femoral neck, left Active Problems:  Unspecified hypothyroidism  HIP PAIN, LEFT  Memory loss  Fall   Femur fracture, left  Abnormal finding on EKG  Leukocytosis  Pre-operative cardiovascular examination  UTI (lower urinary tract infection)    Time spent: 40 minutes   [facility]  [facility] Pager [phone]. If 8PM-8AM, please contact night-coverage at [email or web address], password [facility] 05/03/2012, 9:07 AM  LOS: 3 days

## 2012-05-03 NOTE — Progress Notes (Signed)
ANTICOAGULATION CONSULT NOTE - Follow up Consult  Pharmacy Consult for Coumadin Indication: VTE prophylaxis  Allergies  Allergen Reactions  . Sulfonamide Derivatives Hives    unsure  . Penicillins Rash    Patient Measurements:   Weight: 64 kg (07/2011)  Vital Signs: Temp: 97.6 F (36.4 C) (10/12 0533) BP: 143/62 mmHg (10/12 0533) Pulse Rate: 96  (10/12 0533)  Labs:  Basename 05/03/12 0645 05/02/12 0555 05/01/12 0515 05/01/12 0312 04/30/12 2109 04/30/12 1502 04/30/12 1315  HGB 10.4* 10.9* -- -- -- -- --  HCT 30.9* 30.9* -- 38.9 -- -- --  PLT 176 177 -- 220 -- -- --  APTT -- -- -- -- -- -- --  LABPROT 25.9* 20.1* 14.1 -- -- -- --  INR 2.51* 1.78* 1.10 -- -- -- --  HEPARINUNFRC -- -- -- -- -- -- --  CREATININE -- -- -- 1.01 -- -- 1.01  CKTOTAL -- -- -- -- -- -- --  CKMB -- -- -- -- -- -- --  TROPONINI -- -- -- <0.30 <0.30 <0.30 --    The CrCl is unknown because both a height and weight (above a minimum accepted value) are required for this calculation.   Assessment: 78 yo female s/p hip fracture/repair on 04/30/12 to continue on Coumadin for VTE prophylaxis (planning for one month). Baseline INR of 1.10. INR today is supratherapeutic (2.51) with lower INR goal after one dose of 4 mg and one dose of 1 mg. Larger than expected INR increase, anticipate have not seen full effect of the doses, will hold Coumadin tonight. Hgb about stable from yesterday (13.3>10.9>10.4), plt stable (960>454>098); no bleeding noted.   Goal of Therapy:  INR 1.5-2.0 Monitor platelets by anticoagulation protocol: Yes   Plan:  Hold Coumadin today F/U daily INR  Crist Fat L 05/03/2012,9:14 AM

## 2012-05-03 NOTE — Evaluation (Signed)
Occupational Therapy Evaluation Patient Details Name: Hillory Klinesmith MRN: [id number] DOB: [birth year] Today's Date: 05/03/2012 Time: 4098-1191 OT Time Calculation (min): 27 min  OT Assessment / Plan / Recommendation Clinical Impression  Pt is s/p fall with L hip hemiarthroplasty. She displays increased pain, anxiety with movement, decreased knowledge of THPs and overall decreased functional mobility and ADL. Will benefit from skilled OT services to improve ADL independence for next venue of care.     OT Assessment  Patient needs continued OT Services    Follow Up Recommendations  Skilled nursing facility    Barriers to Discharge      Equipment Recommendations  None recommended by PT;Other (comment) (TBD if pt were to d/c home. Recommend SNF)    Recommendations for Other Services    Frequency  Min 1X/week    Precautions / Restrictions Precautions Precautions: Posterior Hip Precaution Booklet Issued: Yes (comment) Precaution Comments: Educated pt and family on posterior hip precautions.  Required Braces or Orthoses: Knee Immobilizer - Left Knee Immobilizer - Left: On at all times Restrictions Weight Bearing Restrictions: Yes LLE Weight Bearing: Weight bearing as tolerated        ADL  Eating/Feeding: Simulated;Independent Where Assessed - Eating/Feeding: Chair Grooming: Simulated;Wash/dry hands;Set up Where Assessed - Grooming: Supported sitting Upper Body Bathing: Simulated;Chest;Right arm;Left arm;Abdomen;Moderate assistance;Other (comment) (due to pain at EOB and needing some support to balance) Where Assessed - Upper Body Bathing: Unsupported sitting Lower Body Bathing: Simulated;+2 Total assistance Lower Body Bathing: Patient Percentage: 20% Where Assessed - Lower Body Bathing: Supported sit to stand Upper Body Dressing: Simulated;Moderate assistance Where Assessed - Upper Body Dressing: Unsupported sitting Lower Body Dressing: Simulated;+2 Total  assistance Lower Body Dressing: Patient Percentage: 0% Where Assessed - Lower Body Dressing: Supported sit to stand Toilet Transfer: Simulated;+2 Total assistance Toilet Transfer: Patient Percentage: 30% Statistician Method: Sit to stand Toileting - Architect and Hygiene: Simulated;+2 Total assistance Toileting - Architect and Hygiene: Patient Percentage: 0% Where Assessed - Glass blower/designer Manipulation and Hygiene: Sit to stand from 3-in-1 or toilet Tub/Shower Transfer Method: Not assessed Equipment Used: Rolling walker ADL Comments: Pt with alot of pain with any movement/functional mobility. Sit to stand from EOB and a few steps forward. Difficulty picking up L LE to take step but did better with time. Pulled recliner up behind her. Began education on AE and reviewed THPs.    OT Diagnosis: Generalized weakness  OT Problem List: Decreased strength;Decreased activity tolerance;Decreased knowledge of use of DME or AE;Pain;Impaired balance (sitting and/or standing) OT Treatment Interventions: Self-care/ADL training;Therapeutic activities;DME and/or AE instruction;Patient/family education   OT Goals Acute Rehab OT Goals OT Goal Formulation: With patient Time For Goal Achievement: 05/10/12 Potential to Achieve Goals: Good ADL Goals Pt Will Perform Grooming: with supervision;Sitting, edge of bed;Sitting, chair;Unsupported ADL Goal: Grooming - Progress: Goal set today Pt Will Perform Upper Body Bathing: Sitting, chair;Sitting, edge of bed;with supervision (pt 60%) ADL Goal: Upper Body Bathing - Progress: Goal set today Pt Will Perform Lower Body Bathing: with 2+ total assist;Sit to stand from chair;Sit to stand from bed;with adaptive equipment;Other (comment) (pt 60%) ADL Goal: Lower Body Bathing - Progress: Goal set today Pt Will Transfer to Toilet: with 2+ total assist;Stand pivot transfer;3-in-1;Other (comment) (pt 70%) ADL Goal: Toilet Transfer - Progress:  Goal set today Pt Will Perform Toileting - Clothing Manipulation: with 2+ total assist;Other (comment) (pt 60%) ADL Goal: Toileting - Clothing Manipulation - Progress: Goal set today Additional ADL Goal #1: Pt will state 3/3  hip precautions for L LE to adhere to during ADL. ADL Goal: Additional Goal #1 - Progress: Goal set today  Visit Information  Last OT Received On: 05/03/12 Assistance Needed: +2    Subjective Data  Subjective: This is difficult Patient Stated Goal: agreeable to therapy. wants to go home   Prior Functioning     Home Living Lives With: Alone Available Help at Discharge: Skilled Nursing Facility Prior Function Level of Independence: Independent Communication Communication: No difficulties Dominant Hand: Right         Vision/Perception     Cognition  Overall Cognitive Status: Appears within functional limits for tasks assessed/performed Arousal/Alertness: Awake/alert Orientation Level: Appears intact for tasks assessed Behavior During Session: Anxious    Extremity/Trunk Assessment Right Upper Extremity Assessment RUE ROM/Strength/Tone: [facility] for tasks assessed Left Upper Extremity Assessment LUE ROM/Strength/Tone: WFL for tasks assessed     Mobility Bed Mobility Bed Mobility: Supine to Sit Supine to Sit: 1: +2 Total assist;With rails;HOB elevated Supine to Sit: Patient Percentage: 30% Sitting - Scoot to Edge of Bed: 1: +2 Total assist;With rail Sitting - Scoot to [place] of Bed: Patient Percentage: 30% Details for Bed Mobility Assistance: assist for L LE to EOB and trunk to upright. Pt with pain and also gets anxious with movement. increased time. Transfers Transfers: Sit to Stand;Stand to Sit Sit to Stand: 1: +2 Total assist;From bed;With upper extremity assist Sit to Stand: Patient Percentage: 30% Stand to Sit: 1: +2 Total assist;With upper extremity assist Stand to Sit: Patient Percentage: 30% Details for Transfer Assistance: verbal cues for  hand placement and THPs. Needs assist for rising and stabilizing throughout as well as to help descend to chair. Also assist to extend L LE out in front prior to sitting.     Shoulder Instructions     Exercise     Balance     End of Session OT - End of Session Equipment Utilized During Treatment: Gait belt Activity Tolerance: Patient limited by fatigue;Patient limited by pain Patient left: in chair;with call bell/phone within reach  GO     Lennox Laity [phone] 05/03/2012, 1:48 PM

## 2012-05-03 NOTE — Progress Notes (Signed)
Orthopedic Tech Progress Note Patient Details:  Kristina Browning 28-Jun-1926 [id number] OHF with trapeze bar applied to bed. Patient's family present. Patient ID: Kristina Browning, female   DOB: 1926-03-19, 78 y.o.   MRN: [id number]   [name] 05/03/2012, 1:58 PM

## 2012-05-04 LAB — TYPE AND SCREEN
Antibody Screen: POSITIVE
Unit division: 0
Unit division: 0
Unit division: 0

## 2012-05-04 LAB — CBC
MCHC: 34.2 g/dL (ref 30.0–36.0)
Platelets: 212 10*3/uL (ref 150–400)
RDW: 14.4 % (ref 11.5–15.5)

## 2012-05-04 LAB — PROTIME-INR
INR: 1.7 — ABNORMAL HIGH (ref 0.00–1.49)
Prothrombin Time: 19.4 seconds — ABNORMAL HIGH (ref 11.6–15.2)

## 2012-05-04 MED ORDER — HYDROCODONE-ACETAMINOPHEN 5-325 MG PO TABS: 1.0000 | ORAL_TABLET | Freq: Four times a day (QID) | ORAL | Status: DC | PRN

## 2012-05-04 MED ORDER — BISACODYL 10 MG RE SUPP: 10.0000 mg | Freq: Every day | RECTAL | Status: DC | PRN

## 2012-05-04 MED ORDER — LORAZEPAM 0.5 MG PO TABS: 0.2500 mg | ORAL_TABLET | Freq: Two times a day (BID) | ORAL | Status: DC | PRN

## 2012-05-04 MED ORDER — PANTOPRAZOLE SODIUM 40 MG PO TBEC: 40.0000 mg | DELAYED_RELEASE_TABLET | Freq: Every day | ORAL | Status: DC

## 2012-05-04 MED ORDER — FERROUS SULFATE 325 (65 FE) MG PO TABS: 325.0000 mg | ORAL_TABLET | Freq: Two times a day (BID) | ORAL | Status: DC

## 2012-05-04 MED ORDER — WARFARIN VIDEO: 1.0000 | Freq: Once | Status: DC

## 2012-05-04 MED ORDER — WARFARIN VIDEO: 2.5000 | Freq: Once | Status: DC

## 2012-05-04 MED ORDER — WARFARIN SODIUM 1 MG PO TABS
1.0000 mg | ORAL_TABLET | Freq: Once | ORAL | Status: AC
  Administered 2012-05-04: 1 mg via ORAL
  Filled 2012-05-04: qty 1

## 2012-05-04 NOTE — Progress Notes (Signed)
ANTICOAGULATION CONSULT NOTE - Follow up Consult  Pharmacy Consult for Coumadin Indication: VTE prophylaxis  Allergies  Allergen Reactions  . Sulfonamide Derivatives Hives    unsure  . Penicillins Rash    Patient Measurements:    Vital Signs: Temp: 98.9 F (37.2 C) (10/13 0530) Temp src: Oral (10/13 0530) BP: 130/60 mmHg (10/13 0530) Pulse Rate: 77  (10/13 0530)  Labs:  Basename 05/04/12 0640 05/03/12 0645 05/02/12 0555  HGB 11.0* 10.4* --  HCT 32.2* 30.9* 30.9*  PLT 212 176 177  APTT -- -- --  LABPROT 19.4* 25.9* 20.1*  INR 1.70* 2.51* 1.78*  HEPARINUNFRC -- -- --  CREATININE -- -- --  CKTOTAL -- -- --  CKMB -- -- --  TROPONINI -- -- --    The CrCl is unknown because both a height and weight (above a minimum accepted value) are required for this calculation.   Assessment: 78 yo female s/p hip fracture/repair on 04/30/12 to continue on Coumadin for VTE prophylaxis (planning for one month). Baseline INR of 1.10. INR today is therapeutic (1.70) with lower INR goal after one dose of 4 mg, one dose of 1 mg, and holding for one day. Have seen larger than expected INR increase, will give low dose tonight. Hgb stable from yesterday (13.3>10.9>10.4>11.0), plt stable (220>177>176>212); no bleeding noted.   Goal of Therapy:  INR 1.5-2.0 Monitor platelets by anticoagulation protocol: Yes   Plan:  Coumadin 1 mg PO x1 F/U daily INR  Crist Fat L 05/04/2012,1:53 PM

## 2012-05-04 NOTE — Progress Notes (Signed)
Subjective: 4 Days Post-Op Procedure(s) (LRB): ARTHROPLASTY BIPOLAR HIP (Left)  Activity level:  oob with pt Diet tolerance:  ok Voiding:  ok Patient reports pain as 2 on 0-10 scale.    Objective: Vital signs in last 24 hours: Temp:  [98.2 F (36.8 C)-98.9 F (37.2 C)] 98.9 F (37.2 C) (10/13 0530) Pulse Rate:  [68-98] 77  (10/13 0530) Resp:  [16-18] 18  (10/13 0800) BP: (85-130)/(51-61) 130/60 mmHg (10/13 0530) SpO2:  [93 %-99 %] 99 % (10/13 0800)  Labs:  Basename 05/04/12 0640 05/03/12 0645 05/02/12 0555  HGB 11.0* 10.4* 10.9*    Basename 05/04/12 0640 05/03/12 0645  WBC 9.9 11.9*  RBC 3.42* 3.28*  HCT 32.2* 30.9*  PLT 212 176   No results found for this basename: NA:2,K:2,CL:2,CO2:2,BUN:2,CREATININE:2,GLUCOSE:2,CALCIUM:2 in the last 72 hours  Basename 05/04/12 0640 05/03/12 0645  LABPT -- --  INR 1.70* 2.51*    Physical Exam:  Neurologically intact ABD soft Neurovascular intact Sensation intact distally Intact pulses distally Dorsiflexion/Plantar flexion intact Incision: dressing C/D/I No cellulitis present Compartment soft  Assessment/Plan:  4 Days Post-Op Procedure(s) (LRB): ARTHROPLASTY BIPOLAR HIP (Left) Advance diet Up with therapy Discharge to SNF most likely Monday Coumadin x 4 weeks    Coriana Angello R 05/04/2012, 11:31 AM

## 2012-05-04 NOTE — Progress Notes (Signed)
[facility] PROGRESS NOTE  Rhen Dossantos ZOX:[id number] DOB: 24-May-1926 DOA: 04/30/2012 PCP: Ruthe Mannan, MD  Assessment/Plan: Principal Problem:  *Fracture of femoral neck, left Active Problems:  Unspecified hypothyroidism  HIP PAIN, LEFT  Memory loss  Fall  Femur fracture, left  Abnormal finding on EKG  Leukocytosis  Pre-operative cardiovascular examination  UTI (lower urinary tract infection)    1 left femur fracture/status post left hip arthroplasty  Preoperative cardiology clearance was obtained/ EKG does show T-wave inversions in leads 23 aVF and V5 through V6. No old EKG to compare. Call PCPs office and there was no old EKG on file noted. Cardiac enzymes were negative Bedside echo was obtained by cardiology,  On Coumadin for postoperative anticoagulation for one month, with a goal INR of 1.5-2.0  Per orthopedics we'll be ready for discharge at the end of the week  Weight-bear as tolerated on the left with posterior hip precautions. Wear knee immobilizer when in bed and when up discharge to SNF Monday   #2 abnormal EKG  Followed by cardiology   #3 hypothyroidism  Will check a TSH. Continue home dose of Synthroid.   #4 leukocytosis  Likely reactive secondary to problem #1. Chest x-ray is negative. Urinalysis is negative. No need for antibiotics at this time. We'll follow for now.    #5 memory loss/dementia  Monitor for now. We'll place on Ativan as needed for agitation.   #6 prophylaxis  SCDs for DVT prophylaxis. Postoperatively will defer her DVT prophylaxis to orthopedics   #7 acute blood loss anemia  Hemoglobin is up from 15.5-11, we'll monitor closely , CT scan done to rule out retroperitoneal bleed which was negative, don't see any active bleeding therefore continue anticoagulation    Code Status: full  Family Communication: family updated about patient's clinical progress  Disposition Plan: DC to SNF Monday,  Brief narrative:  Kristina Browning  is a 78 y.o. female with history of memory loss, worsening dementia, hypothyroidism who presents to the ED after a fall on the day of admission. Patient has some the history and per patient and sons patient was in the kitchen eating breakfast when she got up and subsequently fell on her left side. Patient denies any syncopal episode. Patient denies any fever, no chills, no chest pain, no shortness of breath, no diarrhea, no constipation, no nausea, no vomiting, no weakness, no cough, no other associated symptoms. Patient did have significant pain in her left hip and subsequently called her sons was subsequently brought her to the ED. Per patient's son's patient has had some episodes of dizziness whenever she ate sausage in the mornings and when she stood up with subsequent issues of almost falling and being caught. Patient and sons denies any cardiac history. No of other associated symptoms. Patient was seen in the ED EKG which was done did show some T wave inversions in leads 2,3 aVF as well as V5 and V6. CBC done did have a white count of 14,000. Basic metabolic profile which was done was unremarkable. Urinalysis which was done was negative. Chest x-ray which was done was negative. Plain films of the left hip which was done did show an acute basicervical proximal left femur fracture. ED physician states he spoke to Dr. Luiz Blare of orthopedics and we were called to admit the patient for further evaluation and management.    Consultants:  Dolores Patty, MD  Harvie Junior, MD  Procedures:  ARTHROPLASTY BIPOLAR HIP (Left  Antibiotics:  None HPI/Subjective:  Doing  well hemodynamically stable overnight Objective: Filed Vitals:   05/03/12 1600 05/03/12 2045 05/04/12 0530 05/04/12 0800  BP:  123/51 130/60   Pulse:  68 77   Temp:  98.7 F (37.1 C) 98.9 F (37.2 C)   TempSrc:  Oral Oral   Resp: 18 18 16 18   SpO2: 99% 94% 95% 99%   No intake or output data in the 24 hours ending 05/04/12  0858  Exam:  HENT:  Head: Atraumatic.  Nose: Nose normal.  Mouth/Throat: Oropharynx is clear and moist.  Eyes: Conjunctivae are normal. Pupils are equal, round, and reactive to light. No scleral icterus.  Neck: Neck supple. No tracheal deviation present.  Cardiovascular: Normal rate, regular rhythm, normal heart sounds and intact distal pulses.  Pulmonary/Chest: Effort normal and breath sounds normal. No respiratory distress.  Abdominal: Soft. Normal appearance and bowel sounds are normal. She exhibits no distension. There is no tenderness.  Musculoskeletal: She exhibits no edema and no tenderness.  Neurological: She is alert. No cranial nerve deficit.    Data Reviewed: Basic Metabolic Panel:  Lab 05/01/12 4098 04/30/12 1315  NA 137 138  K 4.3 3.8  CL 106 104  CO2 23 25  GLUCOSE 144* 108*  BUN 16 16  CREATININE 1.01 1.01  CALCIUM 8.9 9.5  MG -- --  PHOS -- --    Liver Function Tests: No results found for this basename: AST:5,ALT:5,ALKPHOS:5,BILITOT:5,PROT:5,ALBUMIN:5 in the last 168 hours No results found for this basename: LIPASE:5,AMYLASE:5 in the last 168 hours No results found for this basename: AMMONIA:5 in the last 168 hours  CBC:  Lab 05/03/12 0645 05/02/12 0555 05/01/12 0312 04/30/12 1315  WBC 11.9* 15.5* 18.8* 14.0*  NEUTROABS -- 10.9* -- 11.2*  HGB 10.4* 10.9* 13.3 15.5*  HCT 30.9* 30.9* 38.9 45.3  MCV 94.2 95.1 95.6 95.0  PLT 176 177 220 244    Cardiac Enzymes:  Lab 05/01/12 0312 04/30/12 2109 04/30/12 1502  CKTOTAL -- -- --  CKMB -- -- --  CKMBINDEX -- -- --  TROPONINI <0.30 <0.30 <0.30   BNP (last 3 results) No results found for this basename: PROBNP:3 in the last 8760 hours   CBG: No results found for this basename: GLUCAP:5 in the last 168 hours  Recent Results (from the past 240 hour(s))  URINE CULTURE     Status: Normal   Collection Time   04/30/12  1:46 PM      Component Value Range Status Comment   Specimen Description URINE,  CATHETERIZED   Final    Special Requests NONE   Final    Culture  Setup Time 04/30/2012 14:30   Final    Colony Count NO GROWTH   Final    Culture NO GROWTH   Final    Report Status 05/01/2012 FINAL   Final   URINE CULTURE     Status: Normal   Collection Time   05/01/12  8:34 AM      Component Value Range Status Comment   Specimen Description URINE, CATHETERIZED   Final    Special Requests NONE   Final    Culture  Setup Time 05/01/2012 10:26   Final    Colony Count NO GROWTH   Final    Culture NO GROWTH   Final    Report Status 05/02/2012 FINAL   Final      Studies: Ct Abdomen Pelvis Wo Contrast  05/02/2012  *RADIOLOGY REPORT*  Clinical Data: Patient status post left hip arthroplasty on 04/30/2012.  CT ABDOMEN AND PELVIS WITHOUT CONTRAST  Technique:  Multidetector CT imaging of the abdomen and pelvis was performed following the standard protocol without intravenous contrast.  Comparison: None.  Findings: Lung bases are clear.  No pericardial fluid.  No focal hepatic lesion.  The gallbladder, pancreas, spleen, adrenal glands are normal.  Kidneys appear normal.  The stomach is normal.  Hiatal hernia is present.  There is no evidence of bowel obstruction.  No evidence of abscess in the abdomen or pelvis.  There is gas within  bladder presumably related to recent catheterization.  There is  small amount gas in the muscle planes of the left hip from recent arthroplasty.  There is no organized fluid collection. There is no evidence of retroperitoneal hematoma or abscess.  IMPRESSION:  1.  No evidence retroperitoneal hematoma. 2.  No evidence of abscess in the pelvis.  3.  Gas within the tissue planes of the left hip related to recent arthroplasty.  No complication evident. 4.  Gas within bladder presumably related to recent catheterization.   Original Report Authenticated By: Genevive Bi, M.D.    Dg Chest 1 View  04/30/2012  *RADIOLOGY REPORT*  Clinical Data: Fall with left hip deformity.   CHEST - 1 VIEW  Comparison: CT chest 08/30/2011  Findings: Trachea is midline.  Heart size normal.  Biapical pleural parenchymal scarring.  Probable mild scarring at the lung bases versus mild fibrosis.  No pleural fluid.  IMPRESSION: No acute findings.  Bibasilar scarring versus mild fibrosis.   Original Report Authenticated By: Reyes Ivan, M.D.    Dg Hip Complete Left  04/30/2012  *RADIOLOGY REPORT*  Clinical Data: Fall with deformity of the left hip  LEFT HIP - COMPLETE 2+ VIEW  Comparison: None.  Findings: There is an acute basicervical fracture of the proximal left femur.  There is impaction and varus angulation of the fracture fragments.  The femoral head is located.   The right hip is unremarkable.  No significant degenerative changes of either hip, for patient age.  No acute pelvic fracture is identified.  Sacroiliac joints and pubic symphysis appear normal.  IMPRESSION: Acute basicervical proximal left femur fracture.  Per CMS PQRS reporting requirements (PQRS Measure 24): Given the patient's age of greater than 50 and the fracture site (hip, distal radius, or spine), the patient should be tested for osteoporosis using DXA, and the appropriate treatment considered based on the DXA results.   Original Report Authenticated By: Britta Mccreedy, M.D.    Dg Pelvis Portable  05/01/2012  *RADIOLOGY REPORT*  Clinical Data: 78 year old female status post left hip surgery.  PORTABLE PELVIS  Comparison: Preoperative study 04/30/2012.  Findings: Portable AP view 0017 hours.  Left femoral arthroplasty now in place.  Femoral head component appears normally located with respect the acetabulum.  The entire left femoral stem is not included.  Overlying postoperative changes to the soft tissues including skin staples.  No unexpected osseous changes.  IMPRESSION: Proximal left femoral arthroplasty with no adverse features.   Original Report Authenticated By: Harley Hallmark, M.D.    Dg Chest Port 1  View  05/01/2012  *RADIOLOGY REPORT*  Clinical Data: Leukocytosis.  PORTABLE CHEST - 1 VIEW  Comparison: Chest x-ray 04/30/2012.  Findings: Lung volumes are normal.  No consolidative airspace disease.  No pleural effusions.  No pneumothorax.  No pulmonary nodule or mass noted.  Pulmonary vasculature and the cardiomediastinal silhouette are within normal limits.  Bilateral apical pleuroparenchymal thickening (unchanged and most compatible with  scarring).  IMPRESSION: 1.  No radiographic evidence of acute cardiopulmonary disease.   Original Report Authenticated By: Florencia Reasons, M.D.    Dg Hip Portable 1 View Left  05/01/2012  *RADIOLOGY REPORT*  Clinical Data: 78 year old female status post left hip surgery.  PORTABLE LEFT HIP - 1 VIEW  Comparison: 0017 hours the same day and earlier.  Findings: Portable cross-table lateral view at 0024 hours.  Pelvis osseous structures are somewhat over penetrated.  Left femoral hardware appears intact.  Grossly normal alignment.  IMPRESSION: Suboptimal radiographic technique.  Grossly normal alignment status post left proximal femur arthroplasty.   Original Report Authenticated By: Harley Hallmark, M.D.     Scheduled Meds:   . alum & mag hydroxide-simeth  30 mL Oral TID  . cefTRIAXone (ROCEPHIN)  IV  1 g Intravenous Q24H  . citalopram  5 mg Oral QODAY  . coumadin book   Does not apply Once  . ferrous sulfate  325 mg Oral BID WC  . latanoprost  1 drop Both Eyes QHS  . levothyroxine  75 mcg Oral Daily  . pantoprazole  40 mg Oral Q1200  . warfarin   Does not apply Once  . Warfarin - Pharmacist Dosing Inpatient   Does not apply q1800   Continuous Infusions:   . sodium chloride 75 mL/hr at 05/02/12 1900    Principal Problem:  *Fracture of femoral neck, left Active Problems:  Unspecified hypothyroidism  HIP PAIN, LEFT  Memory loss  Fall  Femur fracture, left  Abnormal finding on EKG  Leukocytosis  Pre-operative cardiovascular examination  UTI  (lower urinary tract infection)    Time spent: 40 minutes   [facility]  [facility] Pager [phone]. If 8PM-8AM, please contact night-coverage at [email or web address], password [facility] 05/04/2012, 8:58 AM  LOS: 4 days

## 2012-05-05 LAB — CBC
HCT: 31.3 % — ABNORMAL LOW (ref 36.0–46.0)
Hemoglobin: 10.7 g/dL — ABNORMAL LOW (ref 12.0–15.0)
MCH: 32.5 pg (ref 26.0–34.0)
MCHC: 34.2 g/dL (ref 30.0–36.0)
RDW: 14.3 % (ref 11.5–15.5)
WBC: 9.1 10*3/uL (ref 4.0–10.5)

## 2012-05-05 LAB — PROTIME-INR
INR: 1.55 — ABNORMAL HIGH (ref 0.00–1.49)
Prothrombin Time: 18.1 seconds — ABNORMAL HIGH (ref 11.6–15.2)

## 2012-05-05 MED ORDER — WARFARIN SODIUM 1 MG PO TABS
1.5000 mg | ORAL_TABLET | Freq: Once | ORAL | Status: DC
  Filled 2012-05-05: qty 1

## 2012-05-05 MED ORDER — HYDROCODONE-ACETAMINOPHEN 5-325 MG PO TABS: 1.0000 | ORAL_TABLET | Freq: Four times a day (QID) | ORAL | Status: DC | PRN

## 2012-05-05 MED ORDER — LORAZEPAM 0.5 MG PO TABS: 0.2500 mg | ORAL_TABLET | Freq: Two times a day (BID) | ORAL | Status: DC | PRN

## 2012-05-05 NOTE — Progress Notes (Signed)
Utilization review completed. Amyia Lodwick, RN, BSN. 

## 2012-05-05 NOTE — Progress Notes (Signed)
ANTICOAGULATION CONSULT NOTE - Follow up Consult  Pharmacy Consult for Coumadin Indication: VTE prophylaxis  Allergies  Allergen Reactions  . Sulfonamide Derivatives Hives    unsure  . Penicillins Rash    Vital Signs: Temp: 99 F (37.2 C) (10/14 0624) Temp src: Oral (10/14 0624) BP: 102/57 mmHg (10/14 0624) Pulse Rate: 86  (10/14 0624)  Labs:  Basename 05/05/12 0530 05/04/12 0640 05/03/12 0645  HGB 10.7* 11.0* --  HCT 31.3* 32.2* 30.9*  PLT 243 212 176  APTT -- -- --  LABPROT 18.1* 19.4* 25.9*  INR 1.55* 1.70* 2.51*  HEPARINUNFRC -- -- --  CREATININE -- -- --  CKTOTAL -- -- --  CKMB -- -- --  TROPONINI -- -- --    The CrCl is unknown because both a height and weight (above a minimum accepted value) are required for this calculation.   Assessment: 78 yo female s/p hip fracture/repair on 04/30/12 to continue on Coumadin for VTE prophylaxis (planning for one month). Baseline INR of 1.10. INR today is therapeutic based on 1.5-2 goal. Patient seems to be very sensitive to coumadin and will likely need a low dose regimen (1.5 - 2mg  daily). Hgb stable and platelet are stable. No bleeding noted.   Goal of Therapy:  INR 1.5-2.0 Monitor platelets by anticoagulation protocol: Yes   Plan:  Coumadin 1.5 mg PO x1 F/U INR in the AM   Thank you,  Brett Fairy, PharmD 05/05/2012 9:30 AM

## 2012-05-05 NOTE — Progress Notes (Signed)
Physical Therapy Treatment Patient Details Name: Kristina Browning MRN: [id number] DOB: 1925/08/30 Today's Date: 05/05/2012 Time: 1310-1350 PT Time Calculation (min): 40 min  PT Assessment / Plan / Recommendation Comments on Treatment Session  Pt has made significant progress in mobility.  Pt pain is better managed and pt presents with less anxiety.  Pt should do well in rehab as SNF. Acute PT will continue to follow and progress in acute setting.      Follow Up Recommendations  Post acute inpatient;Supervision/Assistance - 24 hour     Does the patient have the potential to tolerate intense rehabilitation  No, Recommend SNF  Barriers to Discharge        Equipment Recommendations  None recommended by PT    Recommendations for Other Services OT consult  Frequency 7X/week   Plan Discharge plan remains appropriate;Frequency remains appropriate    Precautions / Restrictions Precautions Precautions: Posterior Hip Precaution Booklet Issued: Yes (comment) Precaution Comments: Educated patient and family on all hip precautions Required Braces or Orthoses: Knee Immobilizer - Left Knee Immobilizer - Left: On at all times Restrictions Weight Bearing Restrictions: Yes LLE Weight Bearing: Weight bearing as tolerated   Pertinent Vitals/Pain 4/10 L hip.      Mobility  Bed Mobility Bed Mobility: Supine to Sit;Sitting - Scoot to Edge of Bed Supine to Sit: 3: Mod assist;HOB elevated Supine to Sit: Patient Percentage: 60% Sitting - Scoot to Edge of Bed: 3: Mod assist;With rail Sitting - Scoot to [place] of Bed: Patient Percentage: 70% Sit to Supine: Not Tested (comment) Details for Bed Mobility Assistance: Assist for L LE, assist to elevate shoulders into sitting position, cues to shift weight to L Hip in sitting.   Much improved from last session   Transfers Transfers: Sit to Stand;Stand to Sit;Stand Pivot Transfers Sit to Stand: 3: Mod assist;From bed;With upper extremity assist;From  chair/3-in-1 Sit to Stand: Patient Percentage: 60% Stand to Sit: 3: Mod assist;To chair/3-in-1;With upper extremity assist;With armrests Stand to Sit: Patient Percentage: 50% Stand Pivot Transfers: 3: Mod assist Details for Transfer Assistance: Cues for technique, asssist to initiate standing and control descent to sit.  Assist to position L LE off the floor to sit.   Ambulation/Gait Ambulation/Gait Assistance: 3: Mod assist;4: Min assist Ambulation/Gait: Patient Percentage: 70% Ambulation Distance (Feet): 90 Feet Assistive device: Rolling walker Ambulation/Gait Assistance Details: Initially pt required verbal and tactile cue to advance each LE. Cues to lift (step) with bilateral LEs instead of sliding feet.  After first few feet pt able to ambulate with occasional cueing for posture and to decrease intensity of R foot impact on the floor at R heel strike (pt stomping R foot) Gait Pattern: Step-to pattern;Decreased step length - right;Decreased step length - left;Trunk flexed;Antalgic;Decreased stance time - left Gait velocity: slow  Stairs: No Wheelchair Mobility Wheelchair Mobility: No    Exercises     PT Diagnosis:    PT Problem List:   PT Treatment Interventions:     PT Goals Acute Rehab PT Goals PT Goal Formulation: With patient/family Time For Goal Achievement: 05/15/12 Potential to Achieve Goals: Fair Pt will go Supine/Side to Sit: with min assist;with HOB 0 degrees PT Goal: Supine/Side to Sit - Progress: Progressing toward goal Pt will Sit at Edge of Bed: with supervision;3-5 min;with no upper extremity support PT Goal: Sit at Edge Of Bed - Progress: Progressing toward goal Pt will go Sit to Supine/Side: with min assist;with HOB 0 degrees PT Goal: Sit to Supine/Side - Progress: Progressing  toward goal Pt will go Sit to Stand: with min assist;with upper extremity assist PT Goal: Sit to Stand - Progress: Progressing toward goal Pt will go Stand to Sit: with min assist;with  upper extremity assist PT Goal: Stand to Sit - Progress: Progressing toward goal Pt will Transfer Bed to Chair/Chair to Bed: with min assist PT Transfer Goal: Bed to Chair/Chair to Bed - Progress: Progressing toward goal Pt will Ambulate: 16 - 50 feet;with supervision;with rolling walker PT Goal: Ambulate - Progress: Progressing toward goal  Visit Information  Last PT Received On: 05/05/12 Assistance Needed: +2    Subjective Data  Subjective: It does not hurt as bad today.   Patient Stated Goal: none stated.     Cognition  Overall Cognitive Status: Appears within functional limits for tasks assessed/performed Arousal/Alertness: Awake/alert Orientation Level: Appears intact for tasks assessed    Balance  Balance Balance Assessed: No  End of Session PT - End of Session Equipment Utilized During Treatment: Gait belt;Left knee immobilizer Activity Tolerance: Patient tolerated treatment well Patient left: in chair;with call bell/phone within reach Nurse Communication: Mobility status   GP     Raphaella Larkin 05/05/2012, 2:09 PM Xin Klawitter L. Carren Blakley DPT [phone]

## 2012-05-05 NOTE — Progress Notes (Signed)
Patient is being assessed by staff at [facility]. Anticipate d/c to SNF today via EMS once assessment is completed.  Son- [name] notified. CSW will monitor. Lorri Frederick. [place]  [phone]

## 2012-05-05 NOTE — Discharge Summary (Addendum)
Physician Discharge Summary  Kristina Browning MRN: [id number] DOB/AGE: 03-15-1926 78 y.o.  PCP: Ruthe Mannan, MD   Admit date: 04/30/2012 Discharge date: 05/05/2012  Discharge Diagnoses:     *Fracture of femoral neck, left    Unspecified hypothyroidism  HIP PAIN, LEFT  Memory loss  Fall  Femur fracture, left  Abnormal finding on EKG  Leukocytosis  Pre-operative cardiovascular examination  UTI (lower urinary tract infection)     Medication List     As of 05/05/2012 12:18 PM    TAKE these medications         bisacodyl 10 MG suppository   Commonly known as: DULCOLAX   Place 1 suppository (10 mg total) rectally daily as needed.      CENTRUM SILVER PO   Take 1 tablet by mouth daily.      citalopram 10 MG tablet   Commonly known as: CELEXA   Take 5 mg by mouth every other day.      ferrous sulfate 325 (65 FE) MG tablet   Take 1 tablet (325 mg total) by mouth 2 (two) times daily with a meal.      HYDROcodone-acetaminophen 5-325 MG per tablet   Commonly known as: NORCO/VICODIN   Take 1-2 tablets by mouth every 6 (six) hours as needed.      latanoprost 0.005 % ophthalmic solution   Commonly known as: XALATAN   Place 1 drop into both eyes at bedtime.      levothyroxine 75 MCG tablet   Commonly known as: SYNTHROID, LEVOTHROID   Take 1 tablet (75 mcg total) by mouth daily.      LORazepam 0.5 MG tablet   Commonly known as: ATIVAN   Take 0.5 tablets (0.25 mg total) by mouth 2 (two) times daily as needed for anxiety.      pantoprazole 40 MG tablet   Commonly known as: PROTONIX   Take 1 tablet (40 mg total) by mouth daily at 12 noon.      warfarin Misc   Commonly known as: COUMADIN   1 each by Does not apply route once.        Discharge Condition: STABLE  Disposition: 01-Home or Self Care   Consults:   Harvie Junior, MD     Significant Diagnostic Studies: Ct Abdomen Pelvis Wo Contrast  05/02/2012  *RADIOLOGY REPORT*  Clinical Data: Patient  status post left hip arthroplasty on 04/30/2012.  CT ABDOMEN AND PELVIS WITHOUT CONTRAST  Technique:  Multidetector CT imaging of the abdomen and pelvis was performed following the standard protocol without intravenous contrast.  Comparison: None.  Findings: Lung bases are clear.  No pericardial fluid.  No focal hepatic lesion.  The gallbladder, pancreas, spleen, adrenal glands are normal.  Kidneys appear normal.  The stomach is normal.  Hiatal hernia is present.  There is no evidence of bowel obstruction.  No evidence of abscess in the abdomen or pelvis.  There is gas within  bladder presumably related to recent catheterization.  There is  small amount gas in the muscle planes of the left hip from recent arthroplasty.  There is no organized fluid collection. There is no evidence of retroperitoneal hematoma or abscess.  IMPRESSION:  1.  No evidence retroperitoneal hematoma. 2.  No evidence of abscess in the pelvis.  3.  Gas within the tissue planes of the left hip related to recent arthroplasty.  No complication evident. 4.  Gas within bladder presumably related to recent catheterization.   Original Report  Authenticated By: Genevive Bi, M.D.    Dg Chest 1 View  04/30/2012  *RADIOLOGY REPORT*  Clinical Data: Fall with left hip deformity.  CHEST - 1 VIEW  Comparison: CT chest 08/30/2011  Findings: Trachea is midline.  Heart size normal.  Biapical pleural parenchymal scarring.  Probable mild scarring at the lung bases versus mild fibrosis.  No pleural fluid.  IMPRESSION: No acute findings.  Bibasilar scarring versus mild fibrosis.   Original Report Authenticated By: Reyes Ivan, M.D.    Dg Hip Complete Left  04/30/2012  *RADIOLOGY REPORT*  Clinical Data: Fall with deformity of the left hip  LEFT HIP - COMPLETE 2+ VIEW  Comparison: None.  Findings: There is an acute basicervical fracture of the proximal left femur.  There is impaction and varus angulation of the fracture fragments.  The femoral head is  located.   The right hip is unremarkable.  No significant degenerative changes of either hip, for patient age.  No acute pelvic fracture is identified.  Sacroiliac joints and pubic symphysis appear normal.  IMPRESSION: Acute basicervical proximal left femur fracture.  Per CMS PQRS reporting requirements (PQRS Measure 24): Given the patient's age of greater than 50 and the fracture site (hip, distal radius, or spine), the patient should be tested for osteoporosis using DXA, and the appropriate treatment considered based on the DXA results.   Original Report Authenticated By: Britta Mccreedy, M.D.    Dg Pelvis Portable  05/01/2012  *RADIOLOGY REPORT*  Clinical Data: 78 year old female status post left hip surgery.  PORTABLE PELVIS  Comparison: Preoperative study 04/30/2012.  Findings: Portable AP view 0017 hours.  Left femoral arthroplasty now in place.  Femoral head component appears normally located with respect the acetabulum.  The entire left femoral stem is not included.  Overlying postoperative changes to the soft tissues including skin staples.  No unexpected osseous changes.  IMPRESSION: Proximal left femoral arthroplasty with no adverse features.   Original Report Authenticated By: Harley Hallmark, M.D.    Dg Chest Port 1 View  05/01/2012  *RADIOLOGY REPORT*  Clinical Data: Leukocytosis.  PORTABLE CHEST - 1 VIEW  Comparison: Chest x-ray 04/30/2012.  Findings: Lung volumes are normal.  No consolidative airspace disease.  No pleural effusions.  No pneumothorax.  No pulmonary nodule or mass noted.  Pulmonary vasculature and the cardiomediastinal silhouette are within normal limits.  Bilateral apical pleuroparenchymal thickening (unchanged and most compatible with scarring).  IMPRESSION: 1.  No radiographic evidence of acute cardiopulmonary disease.   Original Report Authenticated By: Florencia Reasons, M.D.    Dg Hip Portable 1 View Left  05/01/2012  *RADIOLOGY REPORT*  Clinical Data: 78 year old female  status post left hip surgery.  PORTABLE LEFT HIP - 1 VIEW  Comparison: 0017 hours the same day and earlier.  Findings: Portable cross-table lateral view at 0024 hours.  Pelvis osseous structures are somewhat over penetrated.  Left femoral hardware appears intact.  Grossly normal alignment.  IMPRESSION: Suboptimal radiographic technique.  Grossly normal alignment status post left proximal femur arthroplasty.   Original Report Authenticated By: Harley Hallmark, M.D.        Microbiology: Recent Results (from the past 240 hour(s))  URINE CULTURE     Status: Normal   Collection Time   04/30/12  1:46 PM      Component Value Range Status Comment   Specimen Description URINE, CATHETERIZED   Final    Special Requests NONE   Final    Culture  Setup Time  04/30/2012 14:30   Final    Colony Count NO GROWTH   Final    Culture NO GROWTH   Final    Report Status 05/01/2012 FINAL   Final   URINE CULTURE     Status: Normal   Collection Time   05/01/12  8:34 AM      Component Value Range Status Comment   Specimen Description URINE, CATHETERIZED   Final    Special Requests NONE   Final    Culture  Setup Time 05/01/2012 10:26   Final    Colony Count NO GROWTH   Final    Culture NO GROWTH   Final    Report Status 05/02/2012 FINAL   Final      Labs: Results for orders placed during the hospital encounter of 04/30/12 (from the past 48 hour(s))  PROTIME-INR     Status: Abnormal   Collection Time   05/04/12  6:40 AM      Component Value Range Comment   Prothrombin Time 19.4 (*) 11.6 - 15.2 seconds    INR 1.70 (*) 0.00 - 1.49   CBC     Status: Abnormal   Collection Time   05/04/12  6:40 AM      Component Value Range Comment   WBC 9.9  4.0 - 10.5 K/uL    RBC 3.42 (*) 3.87 - 5.11 MIL/uL    Hemoglobin 11.0 (*) 12.0 - 15.0 g/dL    HCT 45.4 (*) 09.8 - 46.0 %    MCV 94.2  78.0 - 100.0 fL    MCH 32.2  26.0 - 34.0 pg    MCHC 34.2  30.0 - 36.0 g/dL    RDW 11.9  14.7 - 82.9 %    Platelets 212  150 - 400  K/uL   PROTIME-INR     Status: Abnormal   Collection Time   05/05/12  5:30 AM      Component Value Range Comment   Prothrombin Time 18.1 (*) 11.6 - 15.2 seconds    INR 1.55 (*) 0.00 - 1.49   CBC     Status: Abnormal   Collection Time   05/05/12  5:30 AM      Component Value Range Comment   WBC 9.1  4.0 - 10.5 K/uL    RBC 3.29 (*) 3.87 - 5.11 MIL/uL    Hemoglobin 10.7 (*) 12.0 - 15.0 g/dL    HCT 56.2 (*) 13.0 - 46.0 %    MCV 95.1  78.0 - 100.0 fL    MCH 32.5  26.0 - 34.0 pg    MCHC 34.2  30.0 - 36.0 g/dL    RDW 86.5  78.4 - 69.6 %    Platelets 243  150 - 400 K/uL      HPI :Kristina Browning is a 78 y.o. female with history of memory loss, worsening dementia, hypothyroidism who presents to the ED after a fall on the day of admission. Patient has some the history and per patient and sons patient was in the kitchen eating breakfast when she got up and subsequently fell on her left side. Patient denies any syncopal episode. Patient denies any fever, no chills, no chest pain, no shortness of breath, no diarrhea, no constipation, no nausea, no vomiting, no weakness, no cough, no other associated symptoms. Patient did have significant pain in her left hip and subsequently called her sons was subsequently brought her to the ED. Per patient's son's patient has had some episodes of dizziness whenever  she ate sausage in the mornings and when she stood up with subsequent issues of almost falling and being caught. Patient and sons denies any cardiac history. No of other associated symptoms. Patient was seen in the ED EKG which was done did show some T wave inversions in leads 2,3 aVF as well as V5 and V6. CBC done did have a white count of 14,000. Basic metabolic profile which was done was unremarkable. Urinalysis which was done was negative. Chest x-ray which was done was negative. Plain films of the left hip which was done did show an acute basicervical proximal left femur fracture. ED physician states he  spoke to Dr. Luiz Blare of orthopedics and we were called to admit the patient for further evaluation and management.   Daniel Bensimhon,MD EVALUATED PATIENT PRIOR TO SURGERY  Her ECG abnormalities are non-specific. We have no old ECGs to compare. She had a negative stress test 5 years ago and denies any anginal symptoms at baseline.  We did echo at bedside. Very limited images but LV function is clearly normal with no obvious wall motion abnormalities. could not visualize aortic valve well but she does not have any AS murmur on exam.  Overall, she was deemed to be a low risk for peri-operative cardiac complications. Cardiology recommended to proceed for surgery   without any further cardiac testing.     HOSPITAL COURSE:   1 left femur fracture/status post left hip arthroplasty  Preoperative cardiology clearance was obtained/ EKG does show T-wave inversions in leads 23 aVF and V5 through V6. No old EKG to compare. . Cardiac enzymes were negative Bedside echo was obtained by cardiology,  On Coumadin for postoperative anticoagulation for one month, with a goal INR of 1.5-2.0  Per orthopedics  ARTHROPLASTY BIPOLAR HIP (Left)  Up with therapy  Discharge to SNF most likely Monday  Coumadin x 4 weeks Follow up in 2 weeks Daily INR monitoring for 2 days  , then q 72 hrs    Weight-bear as tolerated on the left with posterior hip precautions. Wear knee immobilizer when in bed and when up discharge to SNF Monday   #2 abnormal EKG  Followed by cardiology ,stable post op #3 hypothyroidism   TSH 2.071 . Continue home dose of Synthroid.  #4 leukocytosis  Likely reactive secondary to problem #1. Chest x-ray is negative. Urinalysis is negative. No need for antibiotics at this time. We'll follow for now.  #5 memory loss/dementia  Monitor for now. We'll place on Ativan as needed for agitation.  #6 prophylaxis  SCDs for DVT prophylaxis. Postoperatively will defer her DVT prophylaxis to orthopedics  #7  acute blood loss anemia  Hemoglobin is up from 15.5-11, we'll monitor closely , CT scan done to rule out retroperitoneal bleed which was negative, don't see any active bleeding therefore continue anticoagulation , cbc stable, repeat weekly       Discharge Exam: Blood pressure 102/57, pulse 86, temperature 99 F (37.2 C), temperature source Oral, resp. rate 16, SpO2 92.00%.   General: Elderly frail. No respiratory difficulty. Lying flat.  HEENT: normal  Neck: supple. no JVD. Carotids 2+ bilat; no bruits. No lymphadenopathy or thryomegaly appreciated.  Cor: PMI nondisplaced. Regular rate & rhythm. No rubs, gallops or murmurs.  Lungs: clear  Abdomen: soft, nontender, nondistended. No hepatosplenomegaly. No bruits or masses. Good bowel sounds.  Extremities: no cyanosis, clubbing, rash, edema L hip painful  Neuro: alert & oriented x 3, cranial nerves grossly intact. moves all 4 extremities w/o  difficulty. Affect pleasant       Discharge Orders    Future Orders Please Complete By Expires   Diet - low sodium heart healthy      Increase activity slowly         Follow-up Information    Follow up with GRAVES,JOHN L, MD. Schedule an appointment as soon as possible for a visit in 2 weeks.   Contact information:   [address] [place] [address] [phone]       Follow up with Ruthe Mannan, MD. Schedule an appointment as soon as possible for a visit in 1 week. (CBC in one week)    Contact information:   [address] [place] [address] [place], [place] [address] [phone]          Signed: Richarda Overlie 05/05/2012, 12:18 PM

## 2012-05-06 NOTE — Clinical Social Work Placement (Addendum)
    Clinical Social Work Department CLINICAL SOCIAL WORK PLACEMENT NOTE 05/06/2012  Patient:  Kristina Browning, Kristina Browning  Account Number:  [id number] Admit date:  04/30/2012  Clinical Social Worker:  Lupita Leash Lorely Bubb, LCSWA  Date/time:  05/02/2012 10:20 AM  Clinical Social Work is seeking post-discharge placement for this patient at the following level of care:   SKILLED NURSING   (*CSW will update this form in Epic as items are completed)   05/02/2012  Patient/family provided with [facility] Department of Clinical Social Work's list of facilities offering this level of care within the geographic area requested by the patient (or if unable, by the patient's family).  05/02/2012  Patient/family informed of their freedom to choose among providers that offer the needed level of care, that participate in Medicare, Medicaid or managed care program needed by the patient, have an available bed and are willing to accept the patient.  05/02/2012  Patient/family informed of [facility]' ownership interest in [facility], as well as of the fact that they are under no obligation to receive care at this facility.  PASARR submitted to EDS on 05/02/2012 PASARR number received from [employer]DS on 05/02/2012  FL2 transmitted to all facilities in geographic area requested by pt/family on  05/02/2012 FL2 transmitted to all facilities within larger geographic area on   Patient informed that his/her managed care company has contracts with or will negotiate with  certain facilities, including the following:   [employer] vs medicare     Patient/family informed of bed offers received:  05/05/12 Patient chooses bed at [facility]  [facility]) Physician recommends and patient chooses bed at    Patient to be transferred to [facility]  On 05/05/12   Patient to be transferred to facility by Ambulance  The following physician request were entered in Epic:   Additional Comments: Patient was d/c to  SNF today via EMS. Spoke to pt's son [name] who was pleased with d/c arrangements.  Notified SNF and pt's nurse of d/c plan.   Lorri Frederick. [place]  [phone]

## 2012-05-06 NOTE — Clinical Social Work Psychosocial (Addendum)
    Clinical Social Work Department BRIEF PSYCHOSOCIAL ASSESSMENT 05/06/2012  Patient:  Kristina Browning, Kristina Browning     Account Number:  [id number]     Admit date:  04/30/2012  Clinical Social Worker:  Tiburcio Pea  Date/Time:  05/02/2012 10:00 AM  Referred by:  Physician  Date Referred:  05/02/2012 Referred for  SNF Placement   Other Referral:   Interview type:  Other - See comment Other interview type:   Patient and 2 sons-    PSYCHOSOCIAL DATA Living Status:  ALONE Admitted from facility:   Level of care:   Primary support name:  Kristina Browning Primary support relationship to patient:  CHILD, ADULT Degree of support available:   Strong support by both sons who are POA. They share caregiving responsibilities for their mother    Son:  Kristina Browning    CURRENT CONCERNS Current Concerns  Post-Acute Placement   Other Concerns:   Patient has dementia diagnosis. may require ALF once SNF is completed.    SOCIAL WORK ASSESSMENT / PLAN Met with patient and her 2 sons Kristina Browning and Kristina Browning today to discuss need for short term SNF.  Sons live in [place] countiies but would like placement in [place] is possible.  Bed search process discussed and will be intiiated. Patient appears to have [employer] which presents placement challenges.  Also discussed possible need for medicaid application with sons if needed in the future.  Fl2 placed on chart for MD's signature.  Patient defers to her sons for all issues.   Assessment/plan status:  Psychosocial Support/Ongoing Assessment of Needs Other assessment/ plan:   Information/referral to community resources:   SNF bed list provided  Discussed Medicaid application process  Discussed possible after care needs/options including private duty, home health and DME that can be arranged by SNF after completion of rehab.    PATIENT'S/FAMILY'S RESPONSE TO PLAN OF CARE: Although patient defers to her sons- she agrees to SNF  placement. she is alert, somewhat confused and very pleasant.  Sons are extremely supportive and provide for her care at home.  The are requesting assistance with placement.

## 2012-09-25 ENCOUNTER — Ambulatory Visit (INDEPENDENT_AMBULATORY_CARE_PROVIDER_SITE_OTHER): Payer: No Typology Code available for payment source | Admitting: Family Medicine

## 2012-09-25 DIAGNOSIS — J189 Pneumonia, unspecified organism: Secondary | ICD-10-CM | POA: Insufficient documentation

## 2012-09-25 NOTE — Progress Notes (Signed)
Cancelled.  

## 2013-07-06 ENCOUNTER — Emergency Department (HOSPITAL_BASED_OUTPATIENT_CLINIC_OR_DEPARTMENT_OTHER): Payer: Medicare Other

## 2013-07-06 ENCOUNTER — Encounter (HOSPITAL_BASED_OUTPATIENT_CLINIC_OR_DEPARTMENT_OTHER): Payer: Self-pay | Admitting: Emergency Medicine

## 2013-07-06 ENCOUNTER — Emergency Department (HOSPITAL_BASED_OUTPATIENT_CLINIC_OR_DEPARTMENT_OTHER)
Admission: EM | Admit: 2013-07-06 | Discharge: 2013-07-06 | Disposition: A | Discharge status: Home / Self Care | Payer: Medicare Other | Source: Home / Self Care | Attending: Emergency Medicine | Admitting: Emergency Medicine

## 2013-07-06 DIAGNOSIS — Z87440 Personal history of urinary (tract) infections: Secondary | ICD-10-CM | POA: Insufficient documentation

## 2013-07-06 DIAGNOSIS — R5381 Other malaise: Secondary | ICD-10-CM | POA: Insufficient documentation

## 2013-07-06 DIAGNOSIS — R531 Weakness: Secondary | ICD-10-CM

## 2013-07-06 DIAGNOSIS — E0590 Thyrotoxicosis, unspecified without thyrotoxic crisis or storm: Secondary | ICD-10-CM | POA: Insufficient documentation

## 2013-07-06 DIAGNOSIS — Z79899 Other long term (current) drug therapy: Secondary | ICD-10-CM | POA: Insufficient documentation

## 2013-07-06 DIAGNOSIS — Z880 Allergy status to penicillin: Secondary | ICD-10-CM | POA: Insufficient documentation

## 2013-07-06 DIAGNOSIS — F0390 Unspecified dementia without behavioral disturbance: Secondary | ICD-10-CM | POA: Insufficient documentation

## 2013-07-06 DIAGNOSIS — K219 Gastro-esophageal reflux disease without esophagitis: Secondary | ICD-10-CM | POA: Insufficient documentation

## 2013-07-06 HISTORY — DX: Gastro-esophageal reflux disease without esophagitis: K21.9

## 2013-07-06 LAB — CBC WITH DIFFERENTIAL/PLATELET
Basophils Absolute: 0.1 10*3/uL (ref 0.0–0.1)
Eosinophils Relative: 2 % (ref 0–5)
HCT: 40.1 % (ref 36.0–46.0)
Hemoglobin: 13.4 g/dL (ref 12.0–15.0)
Lymphocytes Relative: 25 % (ref 12–46)
Lymphs Abs: 2.5 10*3/uL (ref 0.7–4.0)
MCV: 99.5 fL (ref 78.0–100.0)
Monocytes Absolute: 1.3 10*3/uL — ABNORMAL HIGH (ref 0.1–1.0)
Neutro Abs: 6.2 10*3/uL (ref 1.7–7.7)
RBC: 4.03 MIL/uL (ref 3.87–5.11)
RDW: 13.8 % (ref 11.5–15.5)
WBC: 10.2 10*3/uL (ref 4.0–10.5)

## 2013-07-06 LAB — URINALYSIS, ROUTINE W REFLEX MICROSCOPIC
Glucose, UA: NEGATIVE mg/dL
Ketones, ur: 15 mg/dL — AB
Protein, ur: NEGATIVE mg/dL
Urobilinogen, UA: 1 mg/dL (ref 0.0–1.0)

## 2013-07-06 LAB — URINE MICROSCOPIC-ADD ON

## 2013-07-06 LAB — COMPREHENSIVE METABOLIC PANEL
ALT: 9 U/L (ref 0–35)
AST: 25 U/L (ref 0–37)
CO2: 25 mEq/L (ref 19–32)
Calcium: 9.1 mg/dL (ref 8.4–10.5)
Chloride: 98 mEq/L (ref 96–112)
Creatinine, Ser: 0.9 mg/dL (ref 0.50–1.10)
GFR calc Af Amer: 66 mL/min — ABNORMAL LOW (ref >90)
GFR calc non Af Amer: 57 mL/min — ABNORMAL LOW (ref >90)
Glucose, Bld: 97 mg/dL (ref 70–99)
Total Bilirubin: 0.6 mg/dL (ref 0.3–1.2)

## 2013-07-06 MED ORDER — SODIUM CHLORIDE 0.9 % IV BOLUS (SEPSIS)
1000.0000 mL | Freq: Once | INTRAVENOUS | Status: AC
  Administered 2013-07-06: 1000 mL via INTRAVENOUS

## 2013-07-06 NOTE — ED Notes (Signed)
MD at bedside speaking with patient's son.

## 2013-07-06 NOTE — ED Notes (Signed)
[facility] called for transport.  

## 2013-07-06 NOTE — ED Provider Notes (Signed)
CSN: [id number]     Arrival date & time 07/06/13  1156 History  This chart was scribed for Kristina Lyons, MD by Clydene Laming, ED Scribe. This patient was seen in room MH07/MH07 and the patient's care was started at 12:20 PM.   Chief Complaint  Patient presents with  . Abdominal Pain   The history is provided by the patient. No language interpreter was used.   HPI Comments: Kristina Browning is a 79 y.o. female who presents to the Emergency Department complaining of abdominal pain onset this morning. Pt also complains of generalized weakness. Pt was admitted at [facility] a few days ago and was treated for a UTI. Pt had a negative ct scan.  Pt denies diarrhea, fever, chills or vomiting. Her appetite is slim currently and patient has not eaten nearly anything. Pt resides in an assisted living facility. Pt has a past medical hx of dementia and thyroid disease.  Past Medical History  Diagnosis Date  . Dementia   . Thyroid disease   . Hyperthyroidism   . GERD (gastroesophageal reflux disease)    Past Surgical History  Procedure Laterality Date  . Total hip arthroplasty  04/2012    LEFT TOTAL HIP  . Abdominal hysterectomy    . Hip arthroplasty  04/30/2012    Procedure: ARTHROPLASTY BIPOLAR HIP;  Surgeon: Harvie Junior, MD;  Location: [facility] OR;  Service: Orthopedics;  Laterality: Left;   No family history on file. History  Substance Use Topics  . Smoking status: Never Smoker   . Smokeless tobacco: Never Used  . Alcohol Use: No   OB History   Grav Para Term Preterm Abortions TAB SAB Ect Mult Living                 Review of Systems  Constitutional: Negative for fever and chills.  Gastrointestinal: Positive for abdominal pain.  Neurological: Positive for weakness.    Allergies  Celexa; Exelon; Sulfonamide derivatives; and Penicillins  Home Medications   Current Outpatient Rx  Name  Route  Sig  Dispense  Refill  . bisacodyl (DULCOLAX) 10 MG suppository   Rectal   Place  1 suppository (10 mg total) rectally daily as needed.   12 suppository   0   . EXPIRED: citalopram (CELEXA) 10 MG tablet   Oral   Take 5 mg by mouth every other day.         . ferrous sulfate 325 (65 FE) MG tablet   Oral   Take 1 tablet (325 mg total) by mouth 2 (two) times daily with a meal.   30 tablet   0   . HYDROcodone-acetaminophen (NORCO/VICODIN) 5-325 MG per tablet   Oral   Take 1-2 tablets by mouth every 6 (six) hours as needed.   25 tablet   0   . latanoprost (XALATAN) 0.005 % ophthalmic solution   Both Eyes   Place 1 drop into both eyes at bedtime.         Marland Kitchen levothyroxine (SYNTHROID, LEVOTHROID) 75 MCG tablet   Oral   Take 1 tablet (75 mcg total) by mouth daily.   30 tablet   11     Dispense as written.   Marland Kitchen LORazepam (ATIVAN) 0.5 MG tablet   Oral   Take 0.5 tablets (0.25 mg total) by mouth 2 (two) times daily as needed for anxiety.   20 tablet   0   . Multiple Vitamins-Minerals (CENTRUM SILVER PO)   Oral   Take  1 tablet by mouth daily.           . pantoprazole (PROTONIX) 40 MG tablet   Oral   Take 1 tablet (40 mg total) by mouth daily at 12 noon.   30 tablet   0   . warfarin (COUMADIN) MISC   Does not apply   1 each by Does not apply route once.   30 each   0    Triage Vitals:BP 117/62  Pulse 77  Temp(Src) 97.9 F (36.6 C) (Oral)  Resp 16  Wt 145 lb (65.772 kg)  SpO2 96% Physical Exam  Nursing note and vitals reviewed. Constitutional: She is oriented to person, place, and time. She appears well-developed and well-nourished. No distress.  Pt is an elderly female. She is awake, alert, and appropriate.  HENT:  Head: Normocephalic and atraumatic.  Mucous membranes appear somewhat dried.   Eyes: Conjunctivae and EOM are normal.  Neck: Normal range of motion. Neck supple.  Cardiovascular: Normal rate, regular rhythm and normal heart sounds.   No murmur heard. Pulmonary/Chest: Effort normal and breath sounds normal. No respiratory  distress. She has no wheezes. She has no rales.  Abdominal: Soft. Bowel sounds are normal. She exhibits no distension and no mass. There is no tenderness.  Musculoskeletal: Normal range of motion. She exhibits no edema.  Neurological: She is alert and oriented to person, place, and time. No cranial nerve deficit.  Skin: Skin is warm and dry.  Psychiatric: She has a normal mood and affect. Her behavior is normal.    ED Course  Procedures (including critical care time) DIAGNOSTIC STUDIES: Oxygen Saturation is 96% on RA, normal by my interpretation.    COORDINATION OF CARE: 12:34 PM- Discussed treatment plan with pt at bedside. Pt verbalized understanding and agreement with plan.   Labs Review Labs Reviewed - No data to display Imaging Review No results found.  EKG Interpretation    Date/Time:  Monday July 06 2013 12:42:14 EST Ventricular Rate:  81 PR Interval:  140 QRS Duration: 80 QT Interval:  314 QTC Calculation: 364 R Axis:   70 Text Interpretation:  Normal sinus rhythm ST \\T \ T wave abnormality, consider inferior ischemia Abnormal ECG Confirmed by DELOS  MD, Regan Llorente ([id number]) on 07/06/2013 2:49:26 PM            MDM  No diagnosis found. Patient is an 79 year old female initially brought to the emergency department for evaluation of abdominal pain. She was at the assisted care facility and was evaluated by the care provider there. We're told that she was experiencing abdominal pain and had absent bowel sounds. When I examined her initially her abdomen was soft and nontender and bowel sounds were present. Workup reveals essentially unremarkable laboratory studies along with negative urinalysis, troponin, and abdominal series which did not reveal any acute process. As there is no tenderness to palpation and no abnormal laboratory studies, I do not feel as though a CT of the abdomen and pelvis is indicated.  I had a lengthy discussion with the son who is present at bedside.  He explained to me that she has had a progressive decrease in her function over the past 2 weeks. She was normally ambulatory with a walker, however now does not appear to be interested in getting out of bed. She had recent changes in her living environment and he feels as though she may be depressed. This is certainly a possibility as I have found no other explanation for her symptoms. There  is also concern over her having possible mini strokes. CT scan of the head was performed here and reveals stable atrophy and white matter disease however no acute intracranial abnormality.  I discussed these results with both sons who are present in the emergency department. They were concerned that I was unable to come up with a better explanation for her progressive weakness. I informed them that it is possible that she is coming to the point where she will require more assistance and that arrangements may need to be made for more long-term placement. They understand this, and also understand return if her symptoms worsen or she develops other new or concerning symptoms.    Kristina Lyons, MD 07/06/13 [phone]

## 2013-07-06 NOTE — ED Notes (Signed)
Abdominal pain x 2-4 days.  Pt recently treated for a UTI, seen at [facility] and had a negative CT scan.  Pt reports abdominal pain was worse this am and per EMS facility MD stated she didn't have bowel sounds.

## 2013-07-06 NOTE — ED Notes (Signed)
Family at bedside. 

## 2013-07-06 NOTE — ED Notes (Signed)
MD at bedside. 

## 2013-07-06 NOTE — ED Notes (Signed)
PTAR at bedside 

## 2013-07-06 NOTE — ED Notes (Signed)
Assisted Pt to the restroom with the stedy.

## 2013-07-06 NOTE — ED Notes (Signed)
Patient transported to CT 

## 2013-07-06 NOTE — ED Notes (Signed)
Pt assisted onto bedpan.  Awaiting PTAR.  Family remains at bedside and informed of plan of care.

## 2013-07-06 NOTE — ED Notes (Signed)
Records requested from [facility] 

## 2013-07-07 LAB — URINE CULTURE: Colony Count: NO GROWTH

## 2013-07-08 ENCOUNTER — Telehealth: Payer: Self-pay | Admitting: Internal Medicine

## 2013-07-08 NOTE — Telephone Encounter (Signed)
We received a lab report from [facility] ER.  Regina asked for me to schedule an appointment for patient to come in and discuss her elevated thyroid.  I left a message on patient's home voice mail.  Patient's son called me back and said patient has been in Assisted Living at [facility] for the past 2 years and she is being follow by the Doctor at [facility].  He said to thank [facility] for her concern.

## 2013-07-08 NOTE — Telephone Encounter (Signed)
Noted, thank you for the follow up.

## 2013-07-09 ENCOUNTER — Emergency Department (HOSPITAL_COMMUNITY): Payer: Medicare Other

## 2013-07-09 ENCOUNTER — Inpatient Hospital Stay (HOSPITAL_COMMUNITY)
Admission: EM | Admit: 2013-07-09 | Discharge: 2013-07-14 | DRG: 092 | Disposition: A | Discharge status: Hospice | Payer: Medicare Other | Attending: Internal Medicine | Admitting: Internal Medicine

## 2013-07-09 ENCOUNTER — Encounter (HOSPITAL_COMMUNITY): Payer: Self-pay | Admitting: Emergency Medicine

## 2013-07-09 DIAGNOSIS — K5900 Constipation, unspecified: Secondary | ICD-10-CM

## 2013-07-09 DIAGNOSIS — E871 Hypo-osmolality and hyponatremia: Secondary | ICD-10-CM | POA: Diagnosis present

## 2013-07-09 DIAGNOSIS — F0390 Unspecified dementia without behavioral disturbance: Secondary | ICD-10-CM | POA: Diagnosis present

## 2013-07-09 DIAGNOSIS — Z515 Encounter for palliative care: Secondary | ICD-10-CM

## 2013-07-09 DIAGNOSIS — G253 Myoclonus: Principal | ICD-10-CM | POA: Diagnosis present

## 2013-07-09 DIAGNOSIS — E039 Hypothyroidism, unspecified: Secondary | ICD-10-CM

## 2013-07-09 DIAGNOSIS — Z79899 Other long term (current) drug therapy: Secondary | ICD-10-CM

## 2013-07-09 DIAGNOSIS — Z96649 Presence of unspecified artificial hip joint: Secondary | ICD-10-CM

## 2013-07-09 DIAGNOSIS — R451 Restlessness and agitation: Secondary | ICD-10-CM

## 2013-07-09 DIAGNOSIS — R9431 Abnormal electrocardiogram [ECG] [EKG]: Secondary | ICD-10-CM

## 2013-07-09 DIAGNOSIS — K219 Gastro-esophageal reflux disease without esophagitis: Secondary | ICD-10-CM | POA: Diagnosis present

## 2013-07-09 DIAGNOSIS — R627 Adult failure to thrive: Secondary | ICD-10-CM | POA: Diagnosis present

## 2013-07-09 DIAGNOSIS — R1310 Dysphagia, unspecified: Secondary | ICD-10-CM | POA: Diagnosis present

## 2013-07-09 DIAGNOSIS — H5500 Unspecified nystagmus: Secondary | ICD-10-CM | POA: Diagnosis present

## 2013-07-09 DIAGNOSIS — R259 Unspecified abnormal involuntary movements: Secondary | ICD-10-CM

## 2013-07-09 DIAGNOSIS — R531 Weakness: Secondary | ICD-10-CM

## 2013-07-09 DIAGNOSIS — Z66 Do not resuscitate: Secondary | ICD-10-CM | POA: Diagnosis present

## 2013-07-09 DIAGNOSIS — E869 Volume depletion, unspecified: Secondary | ICD-10-CM | POA: Diagnosis present

## 2013-07-09 DIAGNOSIS — B9789 Other viral agents as the cause of diseases classified elsewhere: Secondary | ICD-10-CM | POA: Diagnosis present

## 2013-07-09 DIAGNOSIS — R251 Tremor, unspecified: Secondary | ICD-10-CM

## 2013-07-09 DIAGNOSIS — R413 Other amnesia: Secondary | ICD-10-CM

## 2013-07-09 DIAGNOSIS — E876 Hypokalemia: Secondary | ICD-10-CM

## 2013-07-09 DIAGNOSIS — A419 Sepsis, unspecified organism: Secondary | ICD-10-CM

## 2013-07-09 DIAGNOSIS — R0600 Dyspnea, unspecified: Secondary | ICD-10-CM

## 2013-07-09 DIAGNOSIS — R9389 Abnormal findings on diagnostic imaging of other specified body structures: Secondary | ICD-10-CM

## 2013-07-09 DIAGNOSIS — F4321 Adjustment disorder with depressed mood: Secondary | ICD-10-CM

## 2013-07-09 DIAGNOSIS — N390 Urinary tract infection, site not specified: Secondary | ICD-10-CM

## 2013-07-09 DIAGNOSIS — H5589 Other irregular eye movements: Secondary | ICD-10-CM

## 2013-07-09 DIAGNOSIS — H519 Unspecified disorder of binocular movement: Secondary | ICD-10-CM

## 2013-07-09 LAB — COMPREHENSIVE METABOLIC PANEL WITH GFR
ALT: 8 U/L (ref 0–35)
AST: 24 U/L (ref 0–37)
Albumin: 2.5 g/dL — ABNORMAL LOW (ref 3.5–5.2)
Alkaline Phosphatase: 67 U/L (ref 39–117)
BUN: 17 mg/dL (ref 6–23)
CO2: 28 meq/L (ref 19–32)
Calcium: 8.6 mg/dL (ref 8.4–10.5)
Chloride: 97 meq/L (ref 96–112)
Creatinine, Ser: 0.76 mg/dL (ref 0.50–1.10)
GFR calc Af Amer: 87 mL/min — ABNORMAL LOW (ref >90)
GFR calc non Af Amer: 75 mL/min — ABNORMAL LOW (ref >90)
Glucose, Bld: 88 mg/dL (ref 70–99)
Potassium: 2.8 meq/L — ABNORMAL LOW (ref 3.5–5.1)
Sodium: 132 meq/L — ABNORMAL LOW (ref 135–145)
Total Bilirubin: 0.6 mg/dL (ref 0.3–1.2)
Total Protein: 6.8 g/dL (ref 6.0–8.3)

## 2013-07-09 LAB — URINALYSIS, ROUTINE W REFLEX MICROSCOPIC
Bilirubin Urine: NEGATIVE
Glucose, UA: NEGATIVE mg/dL
Ketones, ur: NEGATIVE mg/dL
Nitrite: NEGATIVE
Protein, ur: NEGATIVE mg/dL
Specific Gravity, Urine: 1.018 (ref 1.005–1.030)
Urobilinogen, UA: 1 mg/dL (ref 0.0–1.0)
pH: 6.5 (ref 5.0–8.0)

## 2013-07-09 LAB — URINE MICROSCOPIC-ADD ON

## 2013-07-09 LAB — CBC
HCT: 40.2 % (ref 36.0–46.0)
Hemoglobin: 14 g/dL (ref 12.0–15.0)
MCH: 33.9 pg (ref 26.0–34.0)
MCHC: 34.8 g/dL (ref 30.0–36.0)
MCV: 97.3 fL (ref 78.0–100.0)
Platelets: 214 K/uL (ref 150–400)
RBC: 4.13 MIL/uL (ref 3.87–5.11)
RDW: 14 % (ref 11.5–15.5)
WBC: 10.7 K/uL — ABNORMAL HIGH (ref 4.0–10.5)

## 2013-07-09 LAB — SEDIMENTATION RATE: Sed Rate: 11 mm/hr (ref 0–22)

## 2013-07-09 LAB — POCT I-STAT TROPONIN I: Troponin i, poc: 0 ng/mL (ref 0.00–0.08)

## 2013-07-09 MED ORDER — ACETAMINOPHEN 650 MG RE SUPP: 650.0000 mg | Freq: Four times a day (QID) | RECTAL | Status: DC | PRN

## 2013-07-09 MED ORDER — LEVOTHYROXINE SODIUM 100 MCG IV SOLR
44.0000 ug | Freq: Every day | INTRAVENOUS | Status: DC
  Administered 2013-07-09 – 2013-07-10 (×2): 44 ug via INTRAVENOUS
  Filled 2013-07-09 (×2): qty 5

## 2013-07-09 MED ORDER — SODIUM CHLORIDE 0.9 % IJ SOLN
3.0000 mL | Freq: Two times a day (BID) | INTRAMUSCULAR | Status: DC
  Administered 2013-07-10 – 2013-07-14 (×6): 3 mL via INTRAVENOUS

## 2013-07-09 MED ORDER — ONDANSETRON HCL 4 MG/2ML IJ SOLN: 4.0000 mg | Freq: Four times a day (QID) | INTRAMUSCULAR | Status: DC | PRN

## 2013-07-09 MED ORDER — POLYVINYL ALCOHOL 1.4 % OP SOLN
2.0000 [drp] | Freq: Three times a day (TID) | OPHTHALMIC | Status: DC
  Administered 2013-07-09 – 2013-07-14 (×14): 2 [drp] via OPHTHALMIC
  Filled 2013-07-09: qty 15

## 2013-07-09 MED ORDER — VALPROATE SODIUM 500 MG/5ML IV SOLN
125.0000 mg | Freq: Two times a day (BID) | INTRAVENOUS | Status: DC
  Administered 2013-07-10 (×2): 125 mg via INTRAVENOUS
  Filled 2013-07-09 (×3): qty 1.25

## 2013-07-09 MED ORDER — CIPROFLOXACIN IN D5W 400 MG/200ML IV SOLN
400.0000 mg | Freq: Two times a day (BID) | INTRAVENOUS | Status: DC
  Administered 2013-07-09 – 2013-07-12 (×6): 400 mg via INTRAVENOUS
  Filled 2013-07-09 (×7): qty 200

## 2013-07-09 MED ORDER — SODIUM CHLORIDE 0.9 % IV BOLUS (SEPSIS)
1000.0000 mL | Freq: Once | INTRAVENOUS | Status: AC
Start: 2013-07-09 — End: 2013-07-09
  Administered 2013-07-09: 1000 mL via INTRAVENOUS

## 2013-07-09 MED ORDER — SODIUM CHLORIDE 0.9 % IV SOLN
INTRAVENOUS | Status: DC
  Administered 2013-07-09: 23:00:00 via INTRAVENOUS

## 2013-07-09 MED ORDER — ACETAMINOPHEN 325 MG PO TABS: 650.0000 mg | ORAL_TABLET | Freq: Four times a day (QID) | ORAL | Status: DC | PRN

## 2013-07-09 MED ORDER — ENOXAPARIN SODIUM 40 MG/0.4ML ~~LOC~~ SOLN
40.0000 mg | SUBCUTANEOUS | Status: DC
  Administered 2013-07-09 – 2013-07-10 (×2): 40 mg via SUBCUTANEOUS
  Filled 2013-07-09 (×3): qty 0.4

## 2013-07-09 MED ORDER — POTASSIUM CHLORIDE 10 MEQ/100ML IV SOLN
10.0000 meq | Freq: Once | INTRAVENOUS | Status: AC
  Administered 2013-07-09: 10 meq via INTRAVENOUS
  Filled 2013-07-09: qty 100

## 2013-07-09 MED ORDER — LATANOPROST 0.005 % OP SOLN
1.0000 [drp] | Freq: Every day | OPHTHALMIC | Status: DC
  Administered 2013-07-09 – 2013-07-12 (×4): 1 [drp] via OPHTHALMIC
  Filled 2013-07-09: qty 2.5

## 2013-07-09 MED ORDER — PANTOPRAZOLE SODIUM 40 MG IV SOLR
40.0000 mg | Freq: Two times a day (BID) | INTRAVENOUS | Status: DC
  Administered 2013-07-09 – 2013-07-13 (×8): 40 mg via INTRAVENOUS
  Filled 2013-07-09 (×10): qty 40

## 2013-07-09 MED ORDER — ONDANSETRON HCL 4 MG PO TABS: 4.0000 mg | ORAL_TABLET | Freq: Four times a day (QID) | ORAL | Status: DC | PRN

## 2013-07-09 MED ORDER — LORAZEPAM 2 MG/ML IJ SOLN
0.5000 mg | Freq: Two times a day (BID) | INTRAMUSCULAR | Status: DC | PRN
  Administered 2013-07-10 – 2013-07-11 (×2): 0.5 mg via INTRAVENOUS
  Filled 2013-07-09 (×4): qty 1

## 2013-07-09 NOTE — H&P (Addendum)
[facility] History and Physical  Kristina Browning ZOX:[id number] DOB: 1926/06/12 DOA: 07/09/2013  Referring physician: EDP PCP: Ruthe Mannan, MD   Chief Complaint: worsening functional decline and abnl eye mov'ts   HPI: Analy Browning is a 79 y.o. female from and ALF in [place] with h/o Dementia, hypothyroidism, GERD who presents with the above complaints. The history is obtained from sons at bedside- they report that over a period of one week pt lost her mobility, unable to feed herself, and her facial expression and speech pattern have all changed markedly. They also report abnl eye mov'ts. They took pt to [facility] iniitially and w/u was unrevealing and pt was d/c and they took [facility] to the [facility] [place] [facility] ED and they were told that her 'thyroid was high'. Pt has also had fevers per family, no cough, dairrhea or abd pain reported. She had N/V and reported dizziness as well. No abd pain reported. Because of her continued functional decline she was brought to the ED. In the ED CT of head neg for acute findings and UA c/w UTI, K 2.8, Na 132. Neuro was consulted and admission to [facility] requested. She is admitted for further eval and management.    Review of Systems As per HPI, otherwise unobtainable.   Past Medical History  Diagnosis Date  . Dementia   . Thyroid disease   . Hyperthyroidism   . GERD (gastroesophageal reflux disease)    Past Surgical History  Procedure Laterality Date  . Total hip arthroplasty  04/2012    LEFT TOTAL HIP  . Abdominal hysterectomy    . Hip arthroplasty  04/30/2012    Procedure: ARTHROPLASTY BIPOLAR HIP;  Surgeon: Harvie Junior, MD;  Location: [facility] OR;  Service: Orthopedics;  Laterality: Left;   Social History:  reports that she has never smoked. She has never used smokeless tobacco. She reports that she does not drink alcohol or use illicit drugs.  Allergies  Allergen Reactions  . Celexa [Citalopram Hydrobromide] Other (See Comments)     Per MAR  . Exelon [Rivastigmine] Other (See Comments)    Per MAR  . Sulfonamide Derivatives Hives    unsure  . Penicillins Rash    Per MAR    Family History  Problem Relation Age of Onset  . Hypertension Mother   . Hypertension Father      Prior to Admission medications   Medication Sig Start Date End Date Taking? Authorizing Provider  acetaminophen (TYLENOL) 325 MG tablet Take 650 mg by mouth every morning.   Yes Historical Provider, MD  Amino Acids-Protein Hydrolys (FEEDING SUPPLEMENT, PRO-STAT SUGAR FREE 64,) LIQD Take 30 mLs by mouth daily.   Yes Historical Provider, MD  cholecalciferol (VITAMIN D) 1000 UNITS tablet Take 1,000 Units by mouth daily.   Yes Historical Provider, MD  Cranberry-Vitamin C-Inulin (UTI-STAT) LIQD Take 30 mLs by mouth daily.   Yes Historical Provider, MD  Dextromethorphan-Guaifenesin (TUSSIN DM PO) Take 10 mLs by mouth every 4 (four) hours. For 10 days for congestion   Yes Historical Provider, MD  divalproex (DEPAKOTE) 125 MG DR tablet Take 125 mg by mouth 2 (two) times daily.   Yes Historical Provider, MD  latanoprost (XALATAN) 0.005 % ophthalmic solution Place 1 drop into both eyes at bedtime.   Yes Historical Provider, MD  levothyroxine (SYNTHROID, LEVOTHROID) 88 MCG tablet Take 88 mcg by mouth daily before breakfast.   Yes Historical Provider, MD  LORazepam (ATIVAN) 0.5 MG tablet Take 0.5 tablets (0.25  mg total) by mouth 2 (two) times daily as needed for anxiety. 05/05/12  Yes Richarda Overlie, MD  omeprazole (PRILOSEC) 20 MG capsule Take 20 mg by mouth daily.   Yes Historical Provider, MD  ondansetron (ZOFRAN) 4 MG tablet Take 4 mg by mouth every 8 (eight) hours as needed for nausea or vomiting.   Yes Historical Provider, MD  polyvinyl alcohol (LIQUIFILM TEARS) 1.4 % ophthalmic solution Place 2 drops into both eyes 3 (three) times daily.   Yes Historical Provider, MD   Physical Exam: Filed Vitals:   07/09/13 1905  BP: 135/56  Pulse: 73  Temp: 98.5  F (36.9 C)  Resp: 16    BP 135/56  Pulse 73  Temp(Src) 98.5 F (36.9 C) (Oral)  Resp 16  SpO2 99% Constitutional: Vital signs reviewed.  Patient is a well-developed and well-nourished  in no acute distress and cooperative with exam. Alert and oriented x1.  Head: Normocephalic and atraumatic Mouth: no erythema or exudates, dry MM Eyes: PERRL, EOMI, conjunctivae normal, No scleral icterus.  Neck: Supple, Trachea midline normal ROM, No JVD, mass, thyromegaly, or carotid bruit present.  Cardiovascular: RRR, S1 normal, S2 normal, no MRG, pulses symmetric and intact bilaterally Pulmonary/Chest: normal respiratory effort, CTAB, no wheezes, rales, or rhonchi Abdominal: Soft. Non-tender, non-distended, bowel sounds are normal, no masses, organomegaly, or guarding present.  GU: no CVA tenderness Musculoskeletal: No joint deformities, erythema, or stiffness, ROM full and no nontender Hematology: no cervical, inginal, or axillary adenopathy.  Neurological: A&O x3, Strength is normal and symmetric bilaterally, cranial nerve II-XII are grossly intact, no focal motor deficit, sensory intact to light touch bilaterally.  Skin: Warm, dry and intact. No rash, cyanosis, or clubbing.  Psychiatric: Normal mood and affect. speech and behavior is normal. Judgment and thought content normal. Cognition and memory are normal.                Labs on Admission:  Basic Metabolic Panel:  Recent Labs Lab 07/06/13 1220 07/09/13 1347  NA 135 132*  K 3.2* 2.8*  CL 98 97  CO2 25 28  GLUCOSE 97 88  BUN 22 17  CREATININE 0.90 0.76  CALCIUM 9.1 8.6   Liver Function Tests:  Recent Labs Lab 07/06/13 1220 07/09/13 1347  AST 25 24  ALT 9 8  ALKPHOS 72 67  BILITOT 0.6 0.6  PROT 7.3 6.8  ALBUMIN 2.7* 2.5*    Recent Labs Lab 07/06/13 1220  LIPASE 16   No results found for this basename: AMMONIA,  in the last 168 hours CBC:  Recent Labs Lab 07/06/13 1220 07/09/13 1347  WBC 10.2 10.7*   NEUTROABS 6.2  --   HGB 13.4 14.0  HCT 40.1 40.2  MCV 99.5 97.3  PLT 244 214   Cardiac Enzymes:  Recent Labs Lab 07/06/13 1220  TROPONINI <0.30    BNP (last 3 results) No results found for this basename: PROBNP,  in the last 8760 hours CBG: No results found for this basename: GLUCAP,  in the last 168 hours  Radiological Exams on Admission: Ct Head Wo Contrast  07/09/2013   CLINICAL DATA:  Altered mental status  EXAM: CT HEAD WITHOUT CONTRAST  TECHNIQUE: Contiguous axial images were obtained from the base of the skull through the vertex without intravenous contrast. Study was obtained within 24 hr of patient's arrival the emergency department.  COMPARISON:  July 06, 2013  FINDINGS: Moderate diffuse atrophy is stable. There is no mass, hemorrhage, extra-axial fluid collection, or  midline shift. Small vessel disease in the centra semiovale bilaterally is stable. There is no new gray-white compartment lesion. No demonstrable acute infarct. Bony calvarium appears intact. Mastoid air cells are clear.  IMPRESSION: Atrophy with small vessel disease, stable. No intracranial mass, hemorrhage, or acute appearing infarct.   Electronically Signed   By: Bretta Bang M.D.   On: 07/09/2013 15:13   Dg Chest Portable 1 View  07/09/2013   CLINICAL DATA:  Dementia.  Failure to thrive.  EXAM: PORTABLE CHEST - 1 VIEW  COMPARISON:  None.  FINDINGS: Cardiopericardial silhouette appears within normal limits. Basilar atelectasis. Monitoring leads project over the chest. Aortic arch atherosclerosis. No airspace disease. No pleural effusion.  IMPRESSION: No active disease.   Electronically Signed   By: Andreas Newport M.D.   On: 07/09/2013 14:59      Assessment/Plan Active Problems: Present on Admission:  . Abnormal eye movements - per neuro Opsoclonus-myoclonus syndrome -follow up B1, B12, TSH, folate, copper, Valproic acid level, MRI of the brain without contrast, EEG ordered per neuro .  Probable Urinary tract infection, site not specified -urine culture, empiric abx . Hypokalemia -replace k, d/t GI losses . Unspecified hypothyroidism -continue synthroid- iv while pt NPO . Dementia -continue depakote, follow up the level ordered in ED .Dysphagia -failed swallow screen in ED -SLP consult for swallow eval -keep NPO for now .Hyponatremia/volume depletion -hydrate, follow and recheck         Code Status: full  Family Communication: sons at bedside Disposition Plan: admit to tele  Time spent: >10mins  Kela Millin [facility] Pager [phone]

## 2013-07-09 NOTE — ED Notes (Signed)
Neurology MD at bedside. Will move patient to POD C following.

## 2013-07-09 NOTE — ED Notes (Signed)
Per EMS, pt was at [facility] on Monday for Failure to thrive, however, they were unable to find anything abnormal. The [facility] with no findings. Both times she was discharged the same day. [facility] physician called EMS and wants pt admitted for failure to thrive. Pt also has eye twitching that started earlier this week.EMS vitals: BP 114/66, HR 90, CBG 90.

## 2013-07-09 NOTE — ED Notes (Signed)
In and out cathed pt for urine culture. Pt tolerated well. Pt had output of .

## 2013-07-09 NOTE — ED Provider Notes (Signed)
CSN: [id number]     Arrival date & time 07/09/13  1140 History   First MD Initiated Contact with Patient 07/09/13 1313     Chief Complaint  Patient presents with  . Failure To Thrive   (Consider location/radiation/quality/duration/timing/severity/associated sxs/prior Treatment) HPI Comments: Patient is an 79 year old female past medical history significant for dementia, thyroid disease, GERD presenting to the emergency department from the nursing home for further evaluation of rapid decline in health with associated eye twitching. In one and a half weeks the patient has drastic decrease in function. Patient used to be able to to assist with multiple ADLs including but not limited to help herself out of bed and ambulate. She is no longer able to assist with those. Patient is typically more conversive and she is at this point. Patient has been evaluated at 2 other hospitals for this decline without significant findings on examination. The patient was noted to have increased thyroid function test results and have her Synthroid reduced. Patient is also currently being slowly taken off of the Depakote that was once used for agitation and behavioral purposes.    Past Medical History  Diagnosis Date  . Dementia   . Thyroid disease   . Hyperthyroidism   . GERD (gastroesophageal reflux disease)    Past Surgical History  Procedure Laterality Date  . Total hip arthroplasty  04/2012    LEFT TOTAL HIP  . Abdominal hysterectomy    . Hip arthroplasty  04/30/2012    Procedure: ARTHROPLASTY BIPOLAR HIP;  Surgeon: Harvie Junior, MD;  Location: [facility] OR;  Service: Orthopedics;  Laterality: Left;   Family History  Problem Relation Age of Onset  . Hypertension Mother   . Hypertension Father    History  Substance Use Topics  . Smoking status: Never Smoker   . Smokeless tobacco: Never Used  . Alcohol Use: No   OB History   Grav Para Term Preterm Abortions TAB SAB Ect Mult Living                  Review of Systems  Unable to perform ROS: Dementia    Allergies  Celexa; Exelon; Sulfonamide derivatives; and Penicillins  Home Medications   No current outpatient prescriptions on file. BP 135/56  Pulse 73  Temp(Src) 98.5 F (36.9 C) (Oral)  Resp 16  SpO2 99% Physical Exam  Constitutional: No distress.  HENT:  Head: Normocephalic and atraumatic.  Right Ear: External ear normal.  Left Ear: External ear normal.  Nose: Nose normal.  Eyes: Conjunctivae are normal. Pupils are equal, round, and reactive to light. Right eye exhibits no discharge. Left eye exhibits no discharge.  Neck: Neck supple.  Cardiovascular: Normal rate, normal heart sounds and intact distal pulses.   Pulmonary/Chest: Effort normal and breath sounds normal.  Abdominal: Soft. There is no tenderness.  Lymphadenopathy:    She has no cervical adenopathy.  Neurological: She is alert. She has normal strength.  Abnormal finger-nose-finger. Involuntary eye movements. Pt is too weak to get from bed for evaluation of gait and Romberg.   Skin: Skin is warm and dry. She is not diaphoretic.    ED Course  Procedures (including critical care time) Medications  sodium chloride 0.9 % bolus 1,000 mL (0 mLs Intravenous Stopped 07/09/13 1644)  potassium chloride 10 mEq in 100 mL IVPB (0 mEq Intravenous Stopped 07/09/13 1644)    Labs Review Labs Reviewed  URINALYSIS, ROUTINE W REFLEX MICROSCOPIC - Abnormal; Notable for the following:  APPearance CLOUDY (*)    Hgb urine dipstick SMALL (*)    Leukocytes, UA LARGE (*)    All other components within normal limits  CBC - Abnormal; Notable for the following:    WBC 10.7 (*)    All other components within normal limits  COMPREHENSIVE METABOLIC PANEL - Abnormal; Notable for the following:    Sodium 132 (*)    Potassium 2.8 (*)    Albumin 2.5 (*)    GFR calc non Af Amer 75 (*)    GFR calc Af Amer 87 (*)    All other components within normal limits  URINE  MICROSCOPIC-ADD ON - Abnormal; Notable for the following:    Crystals CA OXALATE CRYSTALS (*)    All other components within normal limits  VALPROIC ACID LEVEL - Abnormal; Notable for the following:    Valproic Acid Lvl 37.1 (*)    All other components within normal limits  URINE CULTURE  SEDIMENTATION RATE  TSH  T4, FREE  FOLATE  VITAMIN B12  VITAMIN B1  CERULOPLASMIN  POCT I-STAT TROPONIN I   Imaging Review Ct Head Wo Contrast  07/09/2013   CLINICAL DATA:  Altered mental status  EXAM: CT HEAD WITHOUT CONTRAST  TECHNIQUE: Contiguous axial images were obtained from the base of the skull through the vertex without intravenous contrast. Study was obtained within 24 hr of patient's arrival the emergency department.  COMPARISON:  July 06, 2013  FINDINGS: Moderate diffuse atrophy is stable. There is no mass, hemorrhage, extra-axial fluid collection, or midline shift. Small vessel disease in the centra semiovale bilaterally is stable. There is no new gray-white compartment lesion. No demonstrable acute infarct. Bony calvarium appears intact. Mastoid air cells are clear.  IMPRESSION: Atrophy with small vessel disease, stable. No intracranial mass, hemorrhage, or acute appearing infarct.   Electronically Signed   By: Bretta Bang M.D.   On: 07/09/2013 15:13   Dg Chest Portable 1 View  07/09/2013   CLINICAL DATA:  Dementia.  Failure to thrive.  EXAM: PORTABLE CHEST - 1 VIEW  COMPARISON:  None.  FINDINGS: Cardiopericardial silhouette appears within normal limits. Basilar atelectasis. Monitoring leads project over the chest. Aortic arch atherosclerosis. No airspace disease. No pleural effusion.  IMPRESSION: No active disease.   Electronically Signed   By: Andreas Newport M.D.   On: 07/09/2013 14:59    EKG Interpretation    Date/Time:    Ventricular Rate:    PR Interval:    QRS Duration:   QT Interval:    QTC Calculation:   R Axis:     Text Interpretation:              MDM    1. Hypokalemia   2. Generalized weakness   3. Tremor   4. Opsoclonus-myoclonus syndrome    Patient afebrile presenting with acute decline ADLs w/ new onset ocular bobbing and head and BUE tremor that began one week ago. Neurologic findings listed above in physical examination. I have reviewed nursing notes, vital signs, and all appropriate lab and imaging results for this patient. CT negative or w/o acute change from previous EKG. Mild leukocytosis noted. UA showed mild pyuria. Hypokalemic, IV potassium started. Patient did not pass stroke swallow screen and will not be given PO potassium repletion. Neurology consulted and evaluated patient who recommends inpatient for further evaluation of opsoclonus-myoclonus syndrome. Patient will be admitted to hospitalist service for management of hypokalemia. Patient d/w with Dr. Loretha Stapler, agrees with plan.  Jeannetta Ellis, PA-C 07/09/13 1912

## 2013-07-09 NOTE — Consult Note (Addendum)
NEURO HOSPITALIST CONSULT NOTE    Reason for Consult: abnormal eye movements history of 1 month of physical decline.   HPI:                                                                                                                                          Information obtained from chart and ER PA as family is not present  Cydnee Fuquay is an 79 y.o. female who lives in a assisted living center and recently was moved to a new facility due to are issues at previous SNF.  She has a history of Dementia and has been on Depakote for mood stabilization.  This medication has been decreased over the last month and currently she is only 125 mg BID. Pateitn was brought to ED after family noted a rapid decline in strength , not talking as much as prior and abnormal eye movements for the past week. Per note, patient used to be able to to assist with multiple ADLs including but not limited to help herself out of bed and ambulate. She is no longer able to assist with those. Patient is typically more conversive and she is at this point. Patient has been seen at [facility] two times which per family has resulted in no specific diagnosis.  CT head on both 12/15 and 12/18 have shown no acute bleed or infarct.   Currently she is alert, believes she is in [facility], able to tell me this is 2014 and December.  Her main complaint is being very cold.  She denies double vision, visual decline, pain, weakness.    Past Medical History  Diagnosis Date  . Dementia   . Thyroid disease   . Hyperthyroidism   . GERD (gastroesophageal reflux disease)     Past Surgical History  Procedure Laterality Date  . Total hip arthroplasty  04/2012    LEFT TOTAL HIP  . Abdominal hysterectomy    . Hip arthroplasty  04/30/2012    Procedure: ARTHROPLASTY BIPOLAR HIP;  Surgeon: Harvie Junior, MD;  Location: [facility] OR;  Service: Orthopedics;  Laterality: Left;    Family History  Problem Relation  Age of Onset  . Hypertension Mother   . Hypertension Father      Social History:  reports that she has never smoked. She has never used smokeless tobacco. She reports that she does not drink alcohol or use illicit drugs.  Allergies  Allergen Reactions  . Celexa [Citalopram Hydrobromide] Other (See Comments)    Per MAR  . Exelon [Rivastigmine] Other (See Comments)    Per MAR  . Sulfonamide Derivatives Hives    unsure  . Penicillins Rash    Per MAR    MEDICATIONS:  No current facility-administered medications for this encounter.   Current Outpatient Prescriptions  Medication Sig Dispense Refill  . acetaminophen (TYLENOL) 325 MG tablet Take 650 mg by mouth every morning.      . Amino Acids-Protein Hydrolys (FEEDING SUPPLEMENT, PRO-STAT SUGAR FREE 64,) LIQD Take 30 mLs by mouth daily.      . cholecalciferol (VITAMIN D) 1000 UNITS tablet Take 1,000 Units by mouth daily.      . Cranberry-Vitamin C-Inulin (UTI-STAT) LIQD Take 30 mLs by mouth daily.      Marland Kitchen Dextromethorphan-Guaifenesin (TUSSIN DM PO) Take 10 mLs by mouth every 4 (four) hours. For 10 days for congestion      . divalproex (DEPAKOTE) 125 MG DR tablet Take 125 mg by mouth 2 (two) times daily.      Marland Kitchen latanoprost (XALATAN) 0.005 % ophthalmic solution Place 1 drop into both eyes at bedtime.      Marland Kitchen levothyroxine (SYNTHROID, LEVOTHROID) 88 MCG tablet Take 88 mcg by mouth daily before breakfast.      . LORazepam (ATIVAN) 0.5 MG tablet Take 0.5 tablets (0.25 mg total) by mouth 2 (two) times daily as needed for anxiety.  20 tablet  0  . omeprazole (PRILOSEC) 20 MG capsule Take 20 mg by mouth daily.      . ondansetron (ZOFRAN) 4 MG tablet Take 4 mg by mouth every 8 (eight) hours as needed for nausea or vomiting.      . polyvinyl alcohol (LIQUIFILM TEARS) 1.4 % ophthalmic solution Place 2 drops into both eyes 3 (three)  times daily.          ROS:                                                                                                                                       History obtained from the patient  General ROS: negative for - chills, fatigue, fever, night sweats, weight gain or weight loss Psychological ROS: negative for - behavioral disorder, hallucinations, memory difficulties, mood swings or suicidal ideation Ophthalmic ROS: negative for - blurry vision, double vision, eye pain or loss of vision ENT ROS: negative for - epistaxis, nasal discharge, oral lesions, sore throat, tinnitus or vertigo Allergy and Immunology ROS: negative for - hives or itchy/watery eyes Hematological and Lymphatic ROS: negative for - bleeding problems, bruising or swollen lymph nodes Endocrine ROS: negative for - galactorrhea, hair pattern changes, polydipsia/polyuria or temperature intolerance Respiratory ROS: negative for - cough, hemoptysis, shortness of breath or wheezing Cardiovascular ROS: negative for - chest pain, dyspnea on exertion, edema or irregular heartbeat Gastrointestinal ROS: negative for - abdominal pain, diarrhea, hematemesis, nausea/vomiting or stool incontinence Genito-Urinary ROS: negative for - dysuria, hematuria, incontinence or urinary frequency/urgency Musculoskeletal ROS: negative for - joint swelling or muscular weakness Neurological ROS: as noted in HPI Dermatological ROS: negative for rash and skin lesion changes   Blood pressure 143/63, pulse 80, temperature 98.7  F (37.1 C), temperature source Oral, resp. rate 13, SpO2 99.00%.   Neurologic Examination:                                                                                                      Mental Status: Alert, oriented to date, year but believes she is in [facility]. Able to name objects and repeat sentences.  Speech fluent without evidence of aphasia.  Able to follow 3 step commands without  difficulty. Cranial Nerves: II: Discs flat bilaterally; Visual fields grossly normal, pupils equal, round, reactive to light and accommodation III,IV, VI: ptosis not present, extra-ocular motions intact bilaterally with jerking eye movements which are horizontal but at times has a right rotary motion. V,VII: smile symmetric, facial light touch sensation normal bilaterally VIII: hearing normal bilaterally IX,X: gag reflex present XI: bilateral shoulder shrug XII: midline tongue extension--initially was jerky but was able to control this and hold it steady  Motor: Right : Upper extremity   5/5    Left:     Upper extremity   5/5  Lower extremity   5/5     Lower extremity   5/5 Decreased muscle mass throughout, postural and resting tremor but is able to hod still when fully at rest.  Sensory: Pinprick and light touch intact throughout, bilaterally Deep Tendon Reflexes:  Right: Upper Extremity   Left: Upper extremity   biceps (C-5 to C-6) 2/4   biceps (C-5 to C-6) 2/4 tricep (C7) 2/4    triceps (C7) 2/4 Brachioradialis (C6) 2/4  Brachioradialis (C6) 2/4  Lower Extremity Lower Extremity  quadriceps (L-2 to L-4) 2/4   quadriceps (L-2 to L-4) 2/4 Achilles (S1) 2/4   Achilles (S1) 2/4  Plantars: Right: downgoing   Left: downgoing Cerebellar: normal finger-to-nose,  normal heel-to-shin test Gait: not tested CV: pulses palpable throughout    No components found with this basename: cbc,  bmp,  coags,  chol,  tri,  ldl,  hga1c    Results for orders placed during the hospital encounter of 07/09/13 (from the past 48 hour(s))  CBC     Status: Abnormal   Collection Time    07/09/13  1:47 PM      Result Value Range   WBC 10.7 (*) 4.0 - 10.5 K/uL   RBC 4.13  3.87 - 5.11 MIL/uL   Hemoglobin 14.0  12.0 - 15.0 g/dL   HCT 40.9  81.1 - 91.4 %   MCV 97.3  78.0 - 100.0 fL   MCH 33.9  26.0 - 34.0 pg   MCHC 34.8  30.0 - 36.0 g/dL   RDW 78.2  95.6 - 21.3 %   Platelets 214  150 - 400 K/uL   COMPREHENSIVE METABOLIC PANEL     Status: Abnormal   Collection Time    07/09/13  1:47 PM      Result Value Range   Sodium 132 (*) 135 - 145 mEq/L   Potassium 2.8 (*) 3.5 - 5.1 mEq/L   Chloride 97  96 - 112 mEq/L   CO2 28  19 -  32 mEq/L   Glucose, Bld 88  70 - 99 mg/dL   BUN 17  6 - 23 mg/dL   Creatinine, Ser 4.09  0.50 - 1.10 mg/dL   Calcium 8.6  8.4 - 81.1 mg/dL   Total Protein 6.8  6.0 - 8.3 g/dL   Albumin 2.5 (*) 3.5 - 5.2 g/dL   AST 24  0 - 37 U/L   ALT 8  0 - 35 U/L   Alkaline Phosphatase 67  39 - 117 U/L   Total Bilirubin 0.6  0.3 - 1.2 mg/dL   GFR calc non Af Amer 75 (*) >90 mL/min   GFR calc Af Amer 87 (*) >90 mL/min   Comment: (NOTE)     The eGFR has been calculated using the CKD EPI equation.     This calculation has not been validated in all clinical situations.     eGFR's persistently <90 mL/min signify possible Chronic Kidney     Disease.  POCT I-STAT TROPONIN I     Status: None   Collection Time    07/09/13  2:05 PM      Result Value Range   Troponin i, poc 0.00  0.00 - 0.08 ng/mL   Comment 3            Comment: Due to the release kinetics of cTnI,     a negative result within the first hours     of the onset of symptoms does not rule out     myocardial infarction with certainty.     If myocardial infarction is still suspected,     repeat the test at appropriate intervals.  URINALYSIS, ROUTINE W REFLEX MICROSCOPIC     Status: Abnormal   Collection Time    07/09/13  2:20 PM      Result Value Range   Color, Urine YELLOW  YELLOW   APPearance CLOUDY (*) CLEAR   Specific Gravity, Urine 1.018  1.005 - 1.030   pH 6.5  5.0 - 8.0   Glucose, UA NEGATIVE  NEGATIVE mg/dL   Hgb urine dipstick SMALL (*) NEGATIVE   Bilirubin Urine NEGATIVE  NEGATIVE   Ketones, ur NEGATIVE  NEGATIVE mg/dL   Protein, ur NEGATIVE  NEGATIVE mg/dL   Urobilinogen, UA 1.0  0.0 - 1.0 mg/dL   Nitrite NEGATIVE  NEGATIVE   Leukocytes, UA LARGE (*) NEGATIVE  URINE MICROSCOPIC-ADD ON      Status: Abnormal   Collection Time    07/09/13  2:20 PM      Result Value Range   Squamous Epithelial / LPF RARE  RARE   WBC, UA 11-20  <3 WBC/hpf   RBC / HPF 0-2  <3 RBC/hpf   Bacteria, UA RARE  RARE   Crystals CA OXALATE CRYSTALS (*) NEGATIVE    Ct Head Wo Contrast  07/09/2013   CLINICAL DATA:  Altered mental status  EXAM: CT HEAD WITHOUT CONTRAST  TECHNIQUE: Contiguous axial images were obtained from the base of the skull through the vertex without intravenous contrast. Study was obtained within 24 hr of patient's arrival the emergency department.  COMPARISON:  July 06, 2013  FINDINGS: Moderate diffuse atrophy is stable. There is no mass, hemorrhage, extra-axial fluid collection, or midline shift. Small vessel disease in the centra semiovale bilaterally is stable. There is no new gray-white compartment lesion. No demonstrable acute infarct. Bony calvarium appears intact. Mastoid air cells are clear.  IMPRESSION: Atrophy with small vessel disease, stable. No intracranial mass, hemorrhage, or acute  appearing infarct.   Electronically Signed   By: Bretta Bang M.D.   On: 07/09/2013 15:13   Dg Chest Portable 1 View  07/09/2013   CLINICAL DATA:  Dementia.  Failure to thrive.  EXAM: PORTABLE CHEST - 1 VIEW  COMPARISON:  None.  FINDINGS: Cardiopericardial silhouette appears within normal limits. Basilar atelectasis. Monitoring leads project over the chest. Aortic arch atherosclerosis. No airspace disease. No pleural effusion.  IMPRESSION: No active disease.   Electronically Signed   By: Andreas Newport M.D.   On: 07/09/2013 14:59    Assessment and plan per attending neurologist  Felicie Morn PA-C [facility] [phone]  07/09/2013, 4:45 PM   Patient seen and examined.  Clinical course and management discussed.  Necessary edits performed.  I agree with the above.  Assessment and plan of care developed and discussed below.     Assessment/Plan: 79 year old female with  1-2 week history of abnormal eye movements(opsoclonus/myoclonus), dizziness, impaired gait and tremor.  During this period of time it seems that the patient had a viral illness as well.  Unclear etiology for symptoms.  Would consider acute infarct, nutritional deficiencies and post viral encephalitis.    Recommendations: 1.  B1, B12, TSH, folate, copper 2.  Valproic acid level 3.  MRI of the brain without contrast 4.  EEG 5.  If above is unremarkable would consider LP.   Thana Farr, MD [facility] [phone]  07/09/2013  6:09 PM

## 2013-07-09 NOTE — ED Provider Notes (Signed)
Medical screening examination/treatment/procedure(s) were conducted as a shared visit with non-physician practitioner(s) and myself.  I personally evaluated the patient during the encounter.  EKG Interpretation   None       79 yo female with a significant functional decline in last week or two.  Unable to ambulate now or take care of herself.  Has been seen at two other ED's prior to this visit, but reason for her functional decline has been unclear.  Her exam is notable for frail appearance, constant jerking eye movements, normal grip strength, but decreased ability to perform finger to nose.  Workup in ED has shown an unchanged head CT, hypokalemia, and minimal leukocytosis.  Plan neurology consultation and admission to internal medicine for further evaluation.    Clinical Impression: 1. Hypokalemia   2. Generalized weakness   3. Tremor   4. Opsoclonus-myoclonus syndrome   5. Abnormal eye movements   6. Memory loss     Candyce Churn, MD 07/09/13 [id number]

## 2013-07-09 NOTE — ED Provider Notes (Signed)
Medical screening examination/treatment/procedure(s) were conducted as a shared visit with non-physician practitioner(s) and myself.  I personally evaluated the patient during the encounter.   Please see my separate note.     Candyce Churn, MD 07/09/13 [phone]

## 2013-07-10 ENCOUNTER — Inpatient Hospital Stay (HOSPITAL_COMMUNITY): Payer: Medicare Other

## 2013-07-10 DIAGNOSIS — R9431 Abnormal electrocardiogram [ECG] [EKG]: Secondary | ICD-10-CM

## 2013-07-10 DIAGNOSIS — R9389 Abnormal findings on diagnostic imaging of other specified body structures: Secondary | ICD-10-CM

## 2013-07-10 LAB — CBC
HCT: 37.3 % (ref 36.0–46.0)
Hemoglobin: 13.1 g/dL (ref 12.0–15.0)
MCV: 96.4 fL (ref 78.0–100.0)
Platelets: 211 10*3/uL (ref 150–400)
RBC: 3.87 MIL/uL (ref 3.87–5.11)
WBC: 8.2 10*3/uL (ref 4.0–10.5)

## 2013-07-10 LAB — FOLATE: Folate: 16.2 ng/mL

## 2013-07-10 LAB — URINE CULTURE
Colony Count: NO GROWTH
Culture: NO GROWTH

## 2013-07-10 LAB — BASIC METABOLIC PANEL
BUN: 13 mg/dL (ref 6–23)
Calcium: 8.3 mg/dL — ABNORMAL LOW (ref 8.4–10.5)
GFR calc Af Amer: 87 mL/min — ABNORMAL LOW (ref >90)
GFR calc non Af Amer: 75 mL/min — ABNORMAL LOW (ref >90)
Glucose, Bld: 88 mg/dL (ref 70–99)
Potassium: 2.6 mEq/L — CL (ref 3.5–5.1)
Sodium: 135 mEq/L (ref 135–145)

## 2013-07-10 LAB — MAGNESIUM: Magnesium: 1.7 mg/dL (ref 1.5–2.5)

## 2013-07-10 LAB — T4, FREE: Free T4: 1.33 ng/dL (ref 0.80–1.80)

## 2013-07-10 MED ORDER — LEVOTHYROXINE SODIUM 100 MCG IV SOLR
44.0000 ug | Freq: Every day | INTRAVENOUS | Status: DC
  Administered 2013-07-11 – 2013-07-13 (×3): 44 ug via INTRAVENOUS
  Filled 2013-07-10 (×3): qty 5

## 2013-07-10 MED ORDER — POTASSIUM CHLORIDE 2 MEQ/ML IV SOLN
INTRAVENOUS | Status: DC
  Administered 2013-07-10 – 2013-07-11 (×3): via INTRAVENOUS
  Filled 2013-07-10 (×5): qty 1000

## 2013-07-10 MED ORDER — MUPIROCIN 2 % EX OINT
1.0000 "application " | TOPICAL_OINTMENT | Freq: Two times a day (BID) | CUTANEOUS | Status: DC
  Administered 2013-07-10 – 2013-07-14 (×9): 1 via NASAL
  Filled 2013-07-10 (×2): qty 22

## 2013-07-10 MED ORDER — SODIUM CHLORIDE 0.9 % IV SOLN
INTRAVENOUS | Status: DC
  Administered 2013-07-10: 10:00:00 via INTRAVENOUS
  Filled 2013-07-10 (×2): qty 1000

## 2013-07-10 MED ORDER — LEVOTHYROXINE SODIUM 100 MCG IV SOLR: 50.0000 ug | Freq: Every day | INTRAVENOUS | Status: DC

## 2013-07-10 MED ORDER — MAGNESIUM SULFATE 40 MG/ML IJ SOLN
2.0000 g | Freq: Once | INTRAMUSCULAR | Status: AC
  Administered 2013-07-10: 2 g via INTRAVENOUS
  Filled 2013-07-10: qty 50

## 2013-07-10 MED ORDER — POTASSIUM CHLORIDE 10 MEQ/100ML IV SOLN
10.0000 meq | INTRAVENOUS | Status: AC
  Administered 2013-07-10 (×3): 10 meq via INTRAVENOUS
  Filled 2013-07-10 (×3): qty 100

## 2013-07-10 MED ORDER — STROKE: EARLY STAGES OF RECOVERY BOOK
Freq: Once | Status: AC
  Administered 2013-07-10: 18:00:00
  Filled 2013-07-10: qty 1

## 2013-07-10 MED ORDER — CHLORHEXIDINE GLUCONATE CLOTH 2 % EX PADS
6.0000 | MEDICATED_PAD | Freq: Every day | CUTANEOUS | Status: DC
  Administered 2013-07-10 – 2013-07-14 (×3): 6 via TOPICAL

## 2013-07-10 MED ORDER — LORAZEPAM 2 MG/ML IJ SOLN
0.5000 mg | Freq: Once | INTRAMUSCULAR | Status: AC
  Administered 2013-07-10: 0.5 mg via INTRAVENOUS

## 2013-07-10 MED ORDER — MAGNESIUM SULFATE 50 % IJ SOLN: 2.0000 g | Freq: Once | INTRAVENOUS | Status: DC

## 2013-07-10 MED ORDER — POTASSIUM CHLORIDE 20 MEQ/15ML (10%) PO LIQD
40.0000 meq | Freq: Every day | ORAL | Status: DC
  Administered 2013-07-10: 40 meq via ORAL
  Filled 2013-07-10 (×2): qty 30

## 2013-07-10 NOTE — Evaluation (Signed)
Clinical/Bedside Swallow Evaluation Patient Details  Name: Kristina Browning MRN: [id number] Date of Birth: 24-Apr-1926  Today's Date: 07/10/2013 Time: 1006-1035 SLP Time Calculation (min): 29 min  Past Medical History:  Past Medical History  Diagnosis Date  . Dementia   . Thyroid disease   . Hyperthyroidism   . GERD (gastroesophageal reflux disease)    Past Surgical History:  Past Surgical History  Procedure Laterality Date  . Total hip arthroplasty  04/2012    LEFT TOTAL HIP  . Abdominal hysterectomy    . Hip arthroplasty  04/30/2012    Procedure: ARTHROPLASTY BIPOLAR HIP;  Surgeon: Harvie Junior, MD;  Location: [facility] OR;  Service: Orthopedics;  Laterality: Left;   HPI:  79 y.o. female from and ALF in [place] with h/o Dementia, hypothyroidism, GERD who presents with worsening functional decline and abnl eye mov'ts. The history is obtained from sons at bedside- they report that over a period of one week pt lost her mobility, unable to feed herself, and her facial expression and speech pattern have all changed markedly. They also report abnl eye mov'ts. Pt has also had fevers per family, no cough, dairrhea or abd pain reported. She had N/V and reported dizziness as well. No abd pain reported. In the ED CT of head neg for acute findings and UA c/w UTI, K 2.8, Na 132.  CXR No active disease.  Pt. And son deny history of dysphagia.   Assessment / Plan / Recommendation Clinical Impression  Limited swallow assessment performed during OT treatment session due to pt.'s signifiant vertigo.  She was able to tolerate sitting edge of bed with total posterior support of OT, hand holding with RN and copious verbal encouragement.  Oral transit mildly delayed with decreased coordination exacerbated by jerking/tremorous movements and evidence aspiration with thin liquid via immediate cough.  SLP recommends initiating a conservative diet of Dys 1 texture and honey thick liquids ONLY if tolerating sitting  upright to safely consume po's (unable to tolerate MBS at this time).  She will require max encouragement and assistance.  SLP will follow closely and advance as able.          Aspiration Risk  Moderate    Diet Recommendation Dysphagia 1 (Puree);Honey-thick liquid   Liquid Administration via: Cup;No straw Medication Administration: Crushed with puree Supervision: Staff to assist with self feeding;Full supervision/cueing for compensatory strategies Compensations: Slow rate;Small sips/bites Postural Changes and/or Swallow Maneuvers: Seated upright 90 degrees    Other  Recommendations Recommended Consults:  (MBS if/when able to tolerate) Oral Care Recommendations: Oral care BID Other Recommendations: Order thickener from pharmacy   Follow Up Recommendations   (??CIR??)    Frequency and Duration min 2x/week  2 weeks   Pertinent Vitals/Pain                 Oral/Motor/Sensory Function Overall Oral Motor/Sensory Function:  (TBA)   Ice Chips Ice chips: Within functional limits Presentation: Spoon   Thin Liquid Thin Liquid: Impaired Presentation: Cup;Straw;Spoon Oral Phase Functional Implications:  (delayed transit) Pharyngeal  Phase Impairments: Cough - Immediate    Nectar Thick Nectar Thick Liquid: Not tested   Honey Thick Honey Thick Liquid: Not tested   Puree Puree: Within functional limits   Solid   GO    Solid: Not tested       Royce Macadamia M.Ed [employer] [phone]  07/10/2013

## 2013-07-10 NOTE — Evaluation (Signed)
Physical Therapy Evaluation Patient Details Name: Kristina Browning MRN: [id number] DOB: 30-Dec-1925 Today's Date: 07/10/2013 Time: 7829-5621 PT Time Calculation (min): 26 min  PT Assessment / Plan / Recommendation History of Present Illness  Kristina Browning is a 79 y.o. female from and ALF in [place] with h/o Dementia, hypothyroidism, GERD who presents with decreased functional status and abnormal eye movements. The history is obtained from sons at bedside- they report that over a period of one week pt lost her mobility, unable to feed herself, and her facial expression and speech pattern have all changed markedly. They also report abnl eye mov'ts. They took pt to [facility] iniitially and w/u was unrevealing and pt was d/c and they [facility] to [facility] [place] [facility] and they were told that her 'thyroid was high'. Pt has also had fevers per family, no cough, dairrhea or abd pain reported. She had N/V and reported dizziness as well. No abd pain reported. Because of her continued functional decline she was brought to the ED. In the ED CT of head neg for acute findings and UA c/w UTI, K 2.8, Na 132. Neuro was consulted and admission to [facility] requested. She is admitted for further eval and management.  Clinical Impression  Pt presents with mobility severely limited by anxiety and dizziness.  Pt requires total A with bed mobility, unable to attempt transfers this session.  Pt will benefit from continued PT services to increase functional mobility to decrease burden of care.    PT Assessment  Patient needs continued PT services    Follow Up Recommendations  SNF    Does the patient have the potential to tolerate intense rehabilitation      Barriers to Discharge Decreased caregiver support      Equipment Recommendations  None recommended by PT    Recommendations for Other Services     Frequency Min 2X/week    Precautions / Restrictions Precautions Precautions:  Fall Restrictions Weight Bearing Restrictions: No   Pertinent Vitals/Pain No c/o pain, pt c/o dizziness throughout session      Mobility  Bed Mobility Bed Mobility: Supine to Sit Supine to Sit: 1: +1 Total assist Details for Bed Mobility Assistance: pt very anxious with movement.  Requires her son be nearby and holding her hand to comfort her.  Total A, pt not assisting with sitting up, pt resisting movement due to fear.  cues given for deep breathing    Exercises     PT Diagnosis: Generalized weakness  PT Problem List: Decreased strength;Decreased range of motion;Decreased activity tolerance;Decreased balance;Decreased mobility PT Treatment Interventions: DME instruction;Gait training;Patient/family education;Wheelchair mobility training;Functional mobility training;Therapeutic activities;Therapeutic exercise;Balance training;Neuromuscular re-education;Modalities     PT Goals(Current goals can be found in the care plan section) Acute Rehab PT Goals Patient Stated Goal: none stated PT Goal Formulation: With patient/family Time For Goal Achievement: 07/10/13 Potential to Achieve Goals: Good  Visit Information  Last PT Received On: 07/10/13 Assistance Needed: +2 History of Present Illness: Kristina Browning is a 79 y.o. female from and ALF in [place] with h/o Dementia, hypothyroidism, GERD who presents with decreased functional status and abnormal eye movements. The history is obtained from sons at bedside- they report that over a period of one week pt lost her mobility, unable to feed herself, and her facial expression and speech pattern have all changed markedly. They also report abnl eye mov'ts. They took pt to [facility] iniitially and w/u was unrevealing and pt was d/c  and they took [facility] to the [facility] [place] [facility] and they were told that her 'thyroid was high'. Pt has also had fevers per family, no cough, dairrhea or abd pain reported. She had N/V and reported  dizziness as well. No abd pain reported. Because of her continued functional decline she was brought to the ED. In the ED CT of head neg for acute findings and UA c/w UTI, K 2.8, Na 132. Neuro was consulted and admission to [facility] requested. She is admitted for further eval and management.       Prior Functioning  Home Living Family/patient expects to be discharged to:: Assisted living Home Equipment: Walker - 2 wheels Prior Function Level of Independence: Independent with assistive device(s)    Cognition  Cognition Arousal/Alertness: Awake/alert Behavior During Therapy: Anxious Overall Cognitive Status: Difficult to assess Difficult to assess due to:  (high anxiety)    Extremity/Trunk Assessment Lower Extremity Assessment Lower Extremity Assessment: Generalized weakness Cervical / Trunk Assessment Cervical / Trunk Assessment:  (pt with rigid back, no rotation or bending noted, ? due to fear/anxiety)   Balance Static Sitting Balance Static Sitting - Balance Support: Bilateral upper extremity supported Static Sitting - Level of Assistance: 1: +1 Total assist Static Sitting - Comment/# of Minutes: pt very anxious, given cues for deep breaths, son present and attempting to calm pt.  pt actively resisting and trying to lay down, pt able to tolerate x 3 minutes with total A  End of Session PT - End of Session Activity Tolerance:  (pt limited by anxiety) Patient left: in bed;with call bell/phone within reach;with bed alarm set;with family/visitor present Nurse Communication: Mobility status  GP     Kristina Browning 07/10/2013, 10:09 AM

## 2013-07-10 NOTE — Progress Notes (Signed)
Occupational Therapy Evaluation Patient Details Name: Xee Hollman MRN: [id number] DOB: 12-28-1925 Today's Date: 07/10/2013 Time: 1010-1055 OT Time Calculation (min): 45 min  OT Assessment / Plan / Recommendation History of present illness Marcayla Budge is a 79 y.o. female from and ALF in [place] with h/o Dementia, hypothyroidism, GERD who presents with decreased functional status and abnormal eye movements. The history is obtained from sons at bedside- they report that over a period of one week pt lost her mobility, unable to feed herself, and her facial expression and speech pattern have all changed markedly. They also report abnl eye mov'ts She had N/V and reported dizziness as well. No abd pain reported. Because of her continued functional decline she was brought to the ED. In the ED CT of head neg for acute findings and UA c/w UTI, K 2.8, Na 132. Neuro was consulted and admission to [facility] requested. She is admitted for further eval and management. Pt with opsoclonus myoclonus   Clinical Impression   Pt with significant functional decline. Educated nsg on giving pt frim pressure and external cues, in addition to cuing pt to focus on object during mobility. Pt will need SNF for rehab. Son in agreement to SNF. Would like to talk with SW. Pt will benefit from skilled OT services to facilitate D/C to next venue due to below deficits.    OT Assessment  Patient needs continued OT Services    Follow Up Recommendations  SNF    Barriers to Discharge      Equipment Recommendations  None recommended by OT    Recommendations for Other Services    Frequency  Min 2X/week    Precautions / Restrictions Precautions Precautions: Fall;Other (comment) (feelings of falling/dizziness) Restrictions Weight Bearing Restrictions: No   Pertinent Vitals/Pain Vitals stable    ADL  Eating/Feeding: Other (comment);+1 Total assistance (modified diet. nectar with puree) Transfers/Ambulation Related  to ADLs: not assessed  ADL Comments: total A with all ADL    OT Diagnosis: Generalized weakness;Cognitive deficits;Disturbance of vision;Ataxia  OT Problem List: Decreased strength;Decreased activity tolerance;Impaired balance (sitting and/or standing);Decreased coordination;Impaired vision/perception;Decreased cognition;Decreased safety awareness;Decreased knowledge of use of DME or AE;Decreased knowledge of precautions;Impaired UE functional use OT Treatment Interventions: Self-care/ADL training;Therapeutic exercise;DME and/or AE instruction;Therapeutic activities;Cognitive remediation/compensation;Visual/perceptual remediation/compensation;Patient/family education;Balance training   OT Goals(Current goals can be found in the care plan section) Acute Rehab OT Goals Patient Stated Goal: none stated OT Goal Formulation: With family Time For Goal Achievement: 07/24/13 Potential to Achieve Goals: Fair  Visit Information  Last OT Received On: 07/10/13 Assistance Needed: +2 PT/OT/SLP Co-Evaluation/Treatment: Yes Reason for Co-Treatment: Complexity of the patient's impairments (multi-system involvement) OT goals addressed during session: ADL's and self-care History of Present Illness: Nikky Duba is a 79 y.o. female from and ALF in [place] with h/o Dementia, hypothyroidism, GERD who presents with decreased functional status and abnormal eye movements. The history is obtained from sons at bedside- they report that over a period of one week pt lost her mobility, unable to feed herself, and her facial expression and speech pattern have all changed markedly. They also report abnl eye mov'ts. They took pt to [place] [facility] and w/u was unrevealing and pt was d/c and they [facility] [place] [facility] and they were told that her 'thyroid was high'. Pt has also had fevers per family, no cough, dairrhea or abd pain reported. She had N/V and reported dizziness as well. No abd  pain reported. Because of  her continued functional decline she was brought to the ED. In the ED CT of head neg for acute findings and UA c/w UTI, K 2.8, Na 132. Neuro was consulted and admission to [facility] requested. She is admitted for further eval and management.       Prior Functioning     Home Living Family/patient expects to be discharged to:: Skilled nursing facility Home Equipment: Dan Humphreys - 2 wheels Prior Function Level of Independence: Independent with assistive device(s);Needs assistance (assisted due to memory impairment) Gait / Transfers Assistance Needed: mod I ADL's / Homemaking Assistance Needed: S Comments: Pt indepently ambulated to dining hall for meals. Staff assistetd with ADL as needed Dominant Hand: Right         Vision/Perception Vision - History Baseline Vision: No visual deficits Vision - Assessment Eye Alignment: Impaired (comment) Vision Assessment: Vision impaired - to be further tested in functional context Additional Comments: rapid uncoordinated eye movement. unorganized saccades   Cognition  Cognition Arousal/Alertness: Awake/alert Behavior During Therapy: Restless;Anxious Overall Cognitive Status: History of cognitive impairments - at baseline Memory: Decreased recall of precautions;Decreased short-term memory Difficult to assess due to:  (high anxiety)    Extremity/Trunk Assessment Upper Extremity Assessment Upper Extremity Assessment: RUE deficits/detail;LUE deficits/detail RUE Deficits / Details: ataxic movements BUE same Lower Extremity Assessment Lower Extremity Assessment: Defer to PT evaluation Cervical / Trunk Assessment Cervical / Trunk Assessment: Other exceptions (trunkal ataxia)     Mobility Bed Mobility Bed Mobility: Supine to Sit;Sit to Supine Supine to Sit: 1: +2 Total assist Supine to Sit: Patient Percentage: 0% Sit to Supine: 1: +2 Total assist Sit to Supine: Patient Percentage: 0% Details for Bed Mobility Assistance: pt  very anxious with movement.  Requires her son be nearby and holding her hand to comfort her.  Total A, pt not assisting with sitting up, pt resisting movement due to fear.  cues given for deep breathing Transfers Transfers: Not assessed     Exercise Other Exercises Other Exercises: Pt/family educated on trying to focus on object during mobility Other Exercises: pt supported with blanket rolls/pillows to incresae input and attemtp to decrease feelings of fallin   Balance Balance Balance Assessed: Yes Static Sitting Balance Static Sitting - Balance Support: Feet unsupported;Bilateral upper extremity supported Static Sitting - Level of Assistance: 1: +2 Total assist Static Sitting - Comment/# of Minutes: 5 in while speech assessed swallow   End of Session OT - End of Session Activity Tolerance: Treatment limited secondary to medical complications (Comment) (current symptoms) Patient left: in bed;with call bell/phone within reach;with family/visitor present Nurse Communication: Mobility status;Other (comment) (use of firm input)  GO     Havah Ammon,HILLARY 07/10/2013, 11:20 AM [facility], OTR/L  [phone] 07/10/2013

## 2013-07-10 NOTE — Progress Notes (Addendum)
Patient ID: Kristina Browning  female  WJX:[id number]    DOB: 24-Jun-1926    DOA: 07/09/2013  PCP: Ruthe Mannan, MD  Assessment/Plan: Gait instability with tremor and abnormal eye movements(opsoclonus/myoclonus) - So far workup unremarkable normal B12, folate, ESR, TSH 5.2 with a normal free T4, check T3 - MRI of the brain, EEG pending, neurology following. ? Spinal tap if above workup is negative - Speech therapy for swallow evaluation pending  Hypokalemia - Potassium 2.6 and magnesium of 1.7 this morning - Will aggressively replace potassium IV with maintenance fluids and separately, also IV magnesium    Unspecified hypothyroidism -Place on IV Synthroid, TSH,   5.27, per pharmacy, Synthroid was recently increased to , we'll continue the current dose    Memory loss: Likely due to dementia, ? Unclear if patient also has Parkinson disease, patient's sons reported that she's been having some shuffling gait for last 2 months     Urinary tract infection, site not specified - Follow urine culture and sensitivities, continue ciprofloxacin-   DVT Prophylaxis:  Code Status:  Family Communication: Discussed in detail with patient's 2 sons at bedside.   Disposition:    Subjective: Patient is alert and oriented x2, significant jerking type movements   Objective: Weight change:   Intake/Output Summary (Last 24 hours) at 07/10/13 1143 Last data filed at 07/10/13 0600  Gross per 24 hour  Intake 556.25 ml  Output      0 ml  Net 556.25 ml   Blood pressure 123/56, pulse 74, temperature 97.9 F (36.6 C), temperature source Oral, resp. rate 18, SpO2 97.00%.  Physical Exam: General: Alert and awake, oriented  to self and person  not in any acute distress. CVS: S1-S2 clear, no murmur rubs or gallops Chest: clear to auscultation bilaterally, no wheezing, rales or rhonchi Abdomen: soft nontender, nondistended, normal bowel sounds  Extremities: no cyanosis, clubbing or edema noted  bilaterally Neuro: Jerking type movements, cranial nerves essentially normal except the eye movements, moves all 4 extremities, bilateral lower extremities 3-4/5   Lab Results: Basic Metabolic Panel:  Recent Labs Lab 07/09/13 1347 07/10/13 0750 07/10/13 0847  NA 132* 135  --   K 2.8* 2.6*  --   CL 97 101  --   CO2 28 25  --   GLUCOSE 88 88  --   BUN 17 13  --   CREATININE 0.76 0.74  --   CALCIUM 8.6 8.3*  --   MG  --   --  1.7   Liver Function Tests:  Recent Labs Lab 07/06/13 1220 07/09/13 1347  AST 25 24  ALT 9 8  ALKPHOS 72 67  BILITOT 0.6 0.6  PROT 7.3 6.8  ALBUMIN 2.7* 2.5*    Recent Labs Lab 07/06/13 1220  LIPASE 16   No results found for this basename: AMMONIA,  in the last 168 hours CBC:  Recent Labs Lab 07/06/13 1220 07/09/13 1347 07/10/13 0750  WBC 10.2 10.7* 8.2  NEUTROABS 6.2  --   --   HGB 13.4 14.0 13.1  HCT 40.1 40.2 37.3  MCV 99.5 97.3 96.4  PLT 244 214 211   Cardiac Enzymes:  Recent Labs Lab 07/06/13 1220  TROPONINI <0.30   BNP: No components found with this basename: POCBNP,  CBG: No results found for this basename: GLUCAP,  in the last 168 hours   Micro Results: Recent Results (from the past 240 hour(s))  URINE CULTURE     Status: None   Collection Time  07/06/13 12:34 PM      Result Value Range Status   Specimen Description URINE, CATHETERIZED   Final   Special Requests NONE   Final   Culture  Setup Time     Final   Value: 07/06/2013 17:41     Performed at [facility] Count     Final   Value: NO GROWTH     Performed at [facility]   Culture     Final   Value: NO GROWTH     Performed at [facility]   Report Status 07/07/2013 FINAL   Final  MRSA PCR SCREENING     Status: Abnormal   Collection Time    07/10/13  2:24 AM      Result Value Range Status   MRSA by PCR POSITIVE (*) NEGATIVE Final   Comment:            The GeneXpert MRSA Assay (FDA     approved for NASAL  specimens     only), is one component of a     comprehensive MRSA colonization     surveillance program. It is not     intended to diagnose MRSA     infection nor to guide or     monitor treatment for     MRSA infections.     RESULT CALLED TO, READ BACK BY AND VERIFIED WITH:     A.DUNDON,RN 1610 07/10/13 M.CAMPBELL    Studies/Results: Ct Head Wo Contrast  07/09/2013   CLINICAL DATA:  Altered mental status  EXAM: CT HEAD WITHOUT CONTRAST  TECHNIQUE: Contiguous axial images were obtained from the base of the skull through the vertex without intravenous contrast. Study was obtained within 24 hr of patient's arrival the emergency department.  COMPARISON:  July 06, 2013  FINDINGS: Moderate diffuse atrophy is stable. There is no mass, hemorrhage, extra-axial fluid collection, or midline shift. Small vessel disease in the centra semiovale bilaterally is stable. There is no new gray-white compartment lesion. No demonstrable acute infarct. Bony calvarium appears intact. Mastoid air cells are clear.  IMPRESSION: Atrophy with small vessel disease, stable. No intracranial mass, hemorrhage, or acute appearing infarct.   Electronically Signed   By: Bretta Bang M.D.   On: 07/09/2013 15:13   Ct Head Wo Contrast  07/06/2013   CLINICAL DATA:  Abdominal pain and weakness.  EXAM: CT HEAD WITHOUT CONTRAST  TECHNIQUE: Contiguous axial images were obtained from the base of the skull through the vertex without intravenous contrast.  COMPARISON:  CT head without contrast 08/07/2011.  FINDINGS: Atrophy and white matter disease is similar to the prior exam. No acute cortical infarct, hemorrhage, or mass lesion is present. The ventricles are of normal size. No significant extra-axial fluid collection is present.  The paranasal sinuses and mastoid air cells are clear. The osseous skull is intact.  IMPRESSION: 1. No acute intracranial abnormality. 2. Stable atrophy and diffuse white matter disease. This likely  reflects the sequela of chronic microvascular ischemia.   Electronically Signed   By: Gennette Pac M.D.   On: 07/06/2013 16:04   Dg Chest Portable 1 View  07/09/2013   CLINICAL DATA:  Dementia.  Failure to thrive.  EXAM: PORTABLE CHEST - 1 VIEW  COMPARISON:  None.  FINDINGS: Cardiopericardial silhouette appears within normal limits. Basilar atelectasis. Monitoring leads project over the chest. Aortic arch atherosclerosis. No airspace disease. No pleural effusion.  IMPRESSION: No active disease.   Electronically Signed  By: Andreas Newport M.D.   On: 07/09/2013 14:59   Dg Abd Acute W/chest  07/06/2013   CLINICAL DATA:  Abdominal pain for 2-4 days, recently treated for urinary tract infection  EXAM: ACUTE ABDOMEN SERIES (ABDOMEN 2 VIEW & CHEST 1 VIEW)  COMPARISON:  CT abdomen pelvis- 05/02/2012; chest radiograph - 05/01/2012; 04/30/2012  FINDINGS: Grossly unchanged cardiac silhouette and mediastinal contours with atherosclerotic plaque within the thoracic aorta. The lungs appear hyperexpanded with flattening of the bilateral knee diaphragms a mild diffuse slightly nodular thickening of the pulmonary interstitium. There is persistent mild elevation/ eventration of the right hemidiaphragm. No focal airspace opacities. No pleural effusion or pneumothorax. No evidence of edema.  Nonobstructive bowel gas pattern. Unremarkable colonic stool burden. No pneumoperitoneum, pneumatosis or portal venous gas.  Several phleboliths overlie the left hemipelvis. Otherwise, no definite abnormal intra-abdominal calcifications.  Mild scoliotic curvature of the thoracolumbar spine with associated multilevel DDD. Post left femoral hemi arthroplasty, incompletely evaluated.  Regional soft tissues are normal.  IMPRESSION: 1. Hyperexpanded lungs without acute cardiopulmonary disease. 2. Nonobstructive bowel gas pattern. Unremarkable colonic stool burden.   Electronically Signed   By: Simonne Come M.D.   On: 07/06/2013 15:19     Medications: Scheduled Meds: . Chlorhexidine Gluconate Cloth  6 each Topical Q0600  . ciprofloxacin  400 mg Intravenous Q12H  . enoxaparin (LOVENOX) injection  40 mg Subcutaneous Q24H  . latanoprost  1 drop Both Eyes QHS  . levothyroxine  44 mcg Intravenous Daily  . magnesium sulfate LVP 250-500 ml  2 g Intravenous Once  . mupirocin ointment  1 application Nasal BID  . pantoprazole (PROTONIX) IV  40 mg Intravenous Q12H  . polyvinyl alcohol  2 drop Both Eyes TID  . potassium chloride  10 mEq Intravenous Q1 Hr x 3  . sodium chloride  3 mL Intravenous Q12H      LOS: 1 day   RAI,RIPUDEEP M.D. [facility] 07/10/2013, 11:43 AM Pager: [phone]  If 7PM-7AM, please contact night-coverage [email or web address] Password [email or web address]

## 2013-07-10 NOTE — Procedures (Addendum)
ELECTROENCEPHALOGRAM REPORT   Patient: Kristina Browning       Room #: 1X91 EEG ID: [id number] Age: 79 y.o.        Sex: female Referring Physician: Rai Report Date:  07/10/2013        Interpreting Physician: Thana Farr D  History: Kristina Browning is an 79 y.o. female presenting with opsoclonus.  Medications:  Scheduled: .  stroke: mapping our early stages of recovery book   Does not apply Once  . Chlorhexidine Gluconate Cloth  6 each Topical Q0600  . ciprofloxacin  400 mg Intravenous Q12H  . enoxaparin (LOVENOX) injection  40 mg Subcutaneous Q24H  . latanoprost  1 drop Both Eyes QHS  . [START ON 07/11/2013] levothyroxine  50 mcg Intravenous Daily  . mupirocin ointment  1 application Nasal BID  . pantoprazole (PROTONIX) IV  40 mg Intravenous Q12H  . polyvinyl alcohol  2 drop Both Eyes TID  . sodium chloride  3 mL Intravenous Q12H    Conditions of Recording:  This is a 16 channel EEG carried out with the patient in the awake state.  Description:  The patient's frequent eye movements produce artifact throughout the majority of the tracing.  The underlying waking background activity is slow and consists of a poorly organized mixture of theta and delta activity.  At times the occipital activity takes on a sharp configuration but this is difficult to differentiate from the artifact also present.   No definite drowse or sleep is noted. Hyperventilation and intermittent photic stimulation were not performed.  IMPRESSION: This is an abnormal EEG secondary to general background slowing.  This finding may be seen with a diffuse disturbance that is etiologically nonspecific, but may include a metabolic encephalopathy, among other possibilities.  Artifact was present during the tracing due to the prominent and continuous eye movements and at times it was difficult to differentiate this activity from possible epileptiform activity.  Artifact is favored but clinical correlation is advised.     Thana Farr, MD [facility] [phone] 07/10/2013, 4:33 PM

## 2013-07-10 NOTE — Progress Notes (Signed)
Clinical Social Work Department CLINICAL SOCIAL WORK PLACEMENT NOTE 07/10/2013  Patient:  Kristina Browning, Kristina Browning  Account Number:  [id number] Admit date:  07/09/2013  Clinical Social Worker:  Jetta Lout, Theresia Majors  Date/time:  07/10/2013 05:14 PM  Clinical Social Work is seeking post-discharge placement for this patient at the following level of care:   SKILLED NURSING   (*CSW will update this form in Epic as items are completed)   07/10/2013  Patient/family provided with [facility] Department of Clinical Social Work's list of facilities offering this level of care within the geographic area requested by the patient (or if unable, by the patient's family).  07/10/2013  Patient/family informed of their freedom to choose among providers that offer the needed level of care, that participate in Medicare, Medicaid or managed care program needed by the patient, have an available bed and are willing to accept the patient.  07/10/2013  Patient/family informed of [facility]' ownership interest in [facility], as well as of the fact that they are under no obligation to receive care at this facility.  PASARR submitted to EDS on 09/11/2012 PASARR number received from [employer]DS on 09/11/2012  FL2 transmitted to all facilities in geographic area requested by pt/family on  07/10/2013 FL2 transmitted to all facilities within larger geographic area on   Patient informed that his/her managed care company has contracts with or will negotiate with  certain facilities, including the following:     Patient/family informed of bed offers received:   Patient chooses bed at  Physician recommends and patient chooses bed at    Patient to be transferred to  on   Patient to be transferred to facility by   The following physician request were entered in Epic:   Additional Comments:

## 2013-07-10 NOTE — Progress Notes (Signed)
EEG Completed; Results Pending  

## 2013-07-10 NOTE — Progress Notes (Signed)
UR Completed Teairra Millar Graves-Bigelow, RN,BSN [phone]  

## 2013-07-10 NOTE — Progress Notes (Signed)
Subjective: Patient unchanged today.  No complaints of pain.  EEG and MRI remain pending.    Objective: Current vital signs: BP 123/56  Pulse 74  Temp(Src) 97.9 F (36.6 C) (Oral)  Resp 18  SpO2 97% Vital signs in last 24 hours: Temp:  [97.6 F (36.4 C)-98.8 F (37.1 C)] 97.9 F (36.6 C) (12/19 0600) Pulse Rate:  [69-89] 74 (12/19 0600) Resp:  [13-23] 18 (12/19 0600) BP: (103-143)/(49-88) 123/56 mmHg (12/19 0600) SpO2:  [96 %-100 %] 97 % (12/19 0600)  Intake/Output from previous day: 12/18 0701 - 12/19 0700 In: 556.3 [I.V.:556.3] Out: -  Intake/Output this shift:   Nutritional status: NPO  Neurologic Exam: Mental Status:  Alert, oriented to date, year but believes she is in [facility]. Able to name objects and repeat sentences. Speech fluent without evidence of aphasia. Able to follow 3 step commands without difficulty.  Cranial Nerves:  II: Discs flat bilaterally; Visual fields grossly normal, pupils equal, round, reactive to light and accommodation  III,IV, VI: ptosis not present, extra-ocular motions intact bilaterally with jerking eye movements which are horizontal but at times has a right rotary motion.  V,VII: smile symmetric, facial light touch sensation normal bilaterally  VIII: hearing normal bilaterally  IX,X: gag reflex present  XI: bilateral shoulder shrug  XII: midline tongue extension--initially was jerky but was able to control this and hold it steady  Motor:  Moves all extremities against gravity and to command Sensory: Pinprick and light touch intact throughout, bilaterally  Deep Tendon Reflexes:  2+ throughout Plantars:  Right: downgoing     Left: downgoing  Cerebellar:  normal finger-to-nose, normal heel-to-shin test with tremor  Lab Results: Basic Metabolic Panel:  Recent Labs Lab 07/06/13 1220 07/09/13 1347 07/10/13 0750 07/10/13 0847  NA 135 132* 135  --   K 3.2* 2.8* 2.6*  --   CL 98 97 101  --   CO2 25 28 25   --    GLUCOSE 97 88 88  --   BUN 22 17 13   --   CREATININE 0.90 0.76 0.74  --   CALCIUM 9.1 8.6 8.3*  --   MG  --   --   --  1.7    Liver Function Tests:  Recent Labs Lab 07/06/13 1220 07/09/13 1347  AST 25 24  ALT 9 8  ALKPHOS 72 67  BILITOT 0.6 0.6  PROT 7.3 6.8  ALBUMIN 2.7* 2.5*    Recent Labs Lab 07/06/13 1220  LIPASE 16   No results found for this basename: AMMONIA,  in the last 168 hours  CBC:  Recent Labs Lab 07/06/13 1220 07/09/13 1347 07/10/13 0750  WBC 10.2 10.7* 8.2  NEUTROABS 6.2  --   --   HGB 13.4 14.0 13.1  HCT 40.1 40.2 37.3  MCV 99.5 97.3 96.4  PLT 244 214 211    Cardiac Enzymes:  Recent Labs Lab 07/06/13 1220  TROPONINI <0.30    Lipid Panel: No results found for this basename: CHOL, TRIG, HDL, CHOLHDL, VLDL, LDLCALC,  in the last 168 hours  CBG: No results found for this basename: GLUCAP,  in the last 168 hours  Microbiology: Results for orders placed during the hospital encounter of 07/09/13  MRSA PCR SCREENING     Status: Abnormal   Collection Time    07/10/13  2:24 AM      Result Value Range Status   MRSA by PCR POSITIVE (*) NEGATIVE Final   Comment:  The GeneXpert MRSA Assay (FDA     approved for NASAL specimens     only), is one component of a     comprehensive MRSA colonization     surveillance program. It is not     intended to diagnose MRSA     infection nor to guide or     monitor treatment for     MRSA infections.     RESULT CALLED TO, READ BACK BY AND VERIFIED WITH:     A.DUNDON,RN [id number] 07/10/13 M.CAMPBELL    Coagulation Studies: No results found for this basename: LABPROT, INR,  in the last 72 hours  Imaging: Ct Head Wo Contrast  07/09/2013   CLINICAL DATA:  Altered mental status  EXAM: CT HEAD WITHOUT CONTRAST  TECHNIQUE: Contiguous axial images were obtained from the base of the skull through the vertex without intravenous contrast. Study was obtained within 24 hr of patient's arrival the  emergency department.  COMPARISON:  July 06, 2013  FINDINGS: Moderate diffuse atrophy is stable. There is no mass, hemorrhage, extra-axial fluid collection, or midline shift. Small vessel disease in the centra semiovale bilaterally is stable. There is no new gray-white compartment lesion. No demonstrable acute infarct. Bony calvarium appears intact. Mastoid air cells are clear.  IMPRESSION: Atrophy with small vessel disease, stable. No intracranial mass, hemorrhage, or acute appearing infarct.   Electronically Signed   By: Bretta Bang M.D.   On: 07/09/2013 15:13   Dg Chest Portable 1 View  07/09/2013   CLINICAL DATA:  Dementia.  Failure to thrive.  EXAM: PORTABLE CHEST - 1 VIEW  COMPARISON:  None.  FINDINGS: Cardiopericardial silhouette appears within normal limits. Basilar atelectasis. Monitoring leads project over the chest. Aortic arch atherosclerosis. No airspace disease. No pleural effusion.  IMPRESSION: No active disease.   Electronically Signed   By: Andreas Newport M.D.   On: 07/09/2013 14:59    Medications:  I have reviewed the patient's current medications. Scheduled: . Chlorhexidine Gluconate Cloth  6 each Topical Q0600  . ciprofloxacin  400 mg Intravenous Q12H  . enoxaparin (LOVENOX) injection  40 mg Subcutaneous Q24H  . latanoprost  1 drop Both Eyes QHS  . levothyroxine  44 mcg Intravenous Daily  . mupirocin ointment  1 application Nasal BID  . pantoprazole (PROTONIX) IV  40 mg Intravenous Q12H  . polyvinyl alcohol  2 drop Both Eyes TID  . potassium chloride  10 mEq Intravenous Q1 Hr x 3  . sodium chloride  3 mL Intravenous Q12H  . valproate sodium  125 mg Intravenous Q12H    Assessment/Plan: 79 year old female with 1-2 week history of abnormal eye movements(opsoclonus/myoclonus), dizziness, impaired gait and tremor. During this period of time it seems that the patient had a viral illness as well. Unclear etiology for symptoms. Would consider acute infarct, nutritional  deficiencies and post viral encephalitis. Lab work reveals a normal B12, folate, T4 and sed rate.  VPA level subtherapeutic at 37.1.  B1 level remains pending.    Recommendations: 1.  D/C Depakote 2.  MRI and EEG pending.  Will follow up results.      LOS: 1 day   Thana Farr, MD [facility] [phone] 07/10/2013  10:52 AM

## 2013-07-10 NOTE — Progress Notes (Signed)
Clinical Social Work Department BRIEF PSYCHOSOCIAL ASSESSMENT 07/10/2013  Patient:  Kristina Browning, Kristina Browning     Account Number:  [id number]     Admit date:  07/09/2013  Clinical Social Worker:  Hendricks Milo  Date/Time:  07/10/2013 05:09 PM  Referred by:  Physician  Date Referred:  07/10/2013 Referred for  SNF Placement   Other Referral:   Interview type:  Family Other interview type:    PSYCHOSOCIAL DATA Living Status:  FACILITY Admitted from facility:  [facility] [address] Level of care:  Assisted Living Primary support name:  Yisel Megill [phone] Primary support relationship to patient:  CHILD, ADULT Degree of support available:   Very supportive.    CURRENT CONCERNS  Other Concerns:    SOCIAL WORK ASSESSMENT / PLAN Clinical Social Worker (CSW) met contacted patient's son Kristina Browning to discuss SNF placement. Son agreeable to fax out to [facility]. CSW hand faxed referral to admissions coordinator at [facility] in [place] because that is son's preference.   Assessment/plan status:  Psychosocial Support/Ongoing Assessment of Needs Other assessment/ plan:   Information/referral to community resources:   CSW dropped SNF list off in room for son.    PATIENT'S/FAMILY'S RESPONSE TO PLAN OF CARE: Son thanked CSW for phone call.

## 2013-07-11 DIAGNOSIS — F4321 Adjustment disorder with depressed mood: Secondary | ICD-10-CM

## 2013-07-11 LAB — BASIC METABOLIC PANEL
BUN: 9 mg/dL (ref 6–23)
CO2: 20 mEq/L (ref 19–32)
Chloride: 101 mEq/L (ref 96–112)
Creatinine, Ser: 0.77 mg/dL (ref 0.50–1.10)

## 2013-07-11 LAB — CERULOPLASMIN: Ceruloplasmin: 24 mg/dL (ref 20–60)

## 2013-07-11 LAB — T3: T3, Total: 60.4 ng/dl — ABNORMAL LOW (ref 80.0–204.0)

## 2013-07-11 MED ORDER — HYDRALAZINE HCL 20 MG/ML IJ SOLN: 5.0000 mg | Freq: Once | INTRAMUSCULAR | Status: DC

## 2013-07-11 MED ORDER — HALOPERIDOL LACTATE 5 MG/ML IJ SOLN
2.0000 mg | Freq: Once | INTRAMUSCULAR | Status: AC
  Administered 2013-07-11: 2 mg via INTRAVENOUS
  Filled 2013-07-11: qty 1

## 2013-07-11 MED ORDER — STARCH (THICKENING) PO POWD
ORAL | Status: DC | PRN
  Filled 2013-07-11: qty 227

## 2013-07-11 MED ORDER — POTASSIUM CHLORIDE 20 MEQ/15ML (10%) PO LIQD
40.0000 meq | Freq: Three times a day (TID) | ORAL | Status: DC
  Administered 2013-07-11 (×2): 40 meq via ORAL
  Filled 2013-07-11 (×6): qty 30

## 2013-07-11 NOTE — Progress Notes (Signed)
SLP Cancellation Note  Patient Details Name: Kristina Browning MRN: [id number] DOB: 10-03-1925   Cancelled treatment:        Pt given ativan and haldol for agitation around 4am.  Now somnolent, and insufficiently arousable for assessment of po tolerance.  Will continue efforts.  Deshanta Lady B. Murvin Natal [facility], CCC-SLP [phone]  Leigh Aurora 07/11/2013, 10:20 AM

## 2013-07-11 NOTE — Progress Notes (Signed)
Pt highly agitated, 0.5mg  ativan given, no relief, on call Np notified. 1 x dose of Haldol given, sitter ordered, pt appears more relaxed. Will continue to monitor pt.

## 2013-07-11 NOTE — Progress Notes (Signed)
Patient ID: Kristina Browning  female  HQI:[id number]    DOB: [birth year]    DOA: 07/09/2013  PCP: Ruthe Mannan, MD  Assessment/Plan: Gait instability with tremor and abnormal eye movements(opsoclonus/myoclonus) - So far workup unremarkable normal B12, folate, ESR, TSH 5.2 with a normal free T4, check T3 - MRI of the brain unremarkable, no acute infarct, extensive age-related cerebral atrophy - EEG nonspecific with general background slowing and difficult to differentiate continuous eye movement activity from possible epileptiform activity - Await further neurology recommendations -Swallow evaluation done, started on dysphagia 1 diet  Hypokalemia - Placed on potassium replacement 3 times a day until stable    Unspecified hypothyroidism - Place on IV Synthroid, TSH,  5.27, per pharmacy, Synthroid was recently increased to , we'll continue the current dose    Memory loss: Likely due to dementia, ? Unclear if patient also has Parkinson disease, patient's sons reported that she's been having some shuffling gait for last 2 months     Urinary tract infection, site not specified - Follow urine culture and sensitivities, continue ciprofloxacin-   DVT Prophylaxis:  Code Status:  Family Communication:   Disposition:    Subjective: Overnight events noted,needed Ativan and Haldol for agitation around 4 AM. At the time of my examination, patient was calm but confused  Objective: Weight change:   Intake/Output Summary (Last 24 hours) at 07/11/13 0955 Last data filed at 07/10/13 2242  Gross per 24 hour  Intake      3 ml  Output      0 ml  Net      3 ml   Blood pressure 107/64, pulse 60, temperature 97.5 F (36.4 C), temperature source Oral, resp. rate 18, weight 65.227 kg (143 lb 12.8 oz), SpO2 98.00%.  Physical Exam: General: Alert and awake, oriented to self CVS: S1-S2 clear, no murmur rubs or gallops Chest: CTAB Abdomen: soft NT, ND, NBS  Extremities: no c/c/e  bilaterally Neuro: Jerking type movements, cranial nerves essentially normal except the eye movements, moves all 4 extremities, bilateral lower extremities 3-4/5   Lab Results: Basic Metabolic Panel:  Recent Labs Lab 07/10/13 0750 07/10/13 0847 07/10/13 1253 07/11/13 0449  NA 135  --   --  133*  K 2.6*  --  3.2* 2.9*  CL 101  --   --  101  CO2 25  --   --  20  GLUCOSE 88  --   --  100*  BUN 13  --   --  9  CREATININE 0.74  --   --  0.77  CALCIUM 8.3*  --   --  8.4  MG  --  1.7  --   --    Liver Function Tests:  Recent Labs Lab 07/06/13 1220 07/09/13 1347  AST 25 24  ALT 9 8  ALKPHOS 72 67  BILITOT 0.6 0.6  PROT 7.3 6.8  ALBUMIN 2.7* 2.5*    Recent Labs Lab 07/06/13 1220  LIPASE 16   No results found for this basename: AMMONIA,  in the last 168 hours CBC:  Recent Labs Lab 07/06/13 1220 07/09/13 1347 07/10/13 0750  WBC 10.2 10.7* 8.2  NEUTROABS 6.2  --   --   HGB 13.4 14.0 13.1  HCT 40.1 40.2 37.3  MCV 99.5 97.3 96.4  PLT 244 214 211   Cardiac Enzymes:  Recent Labs Lab 07/06/13 1220  TROPONINI <0.30   BNP: No components found with this basename: POCBNP,  CBG: No results found for this  basename: GLUCAP,  in the last 168 hours   Micro Results: Recent Results (from the past 240 hour(s))  URINE CULTURE     Status: None   Collection Time    07/06/13 12:34 PM      Result Value Range Status   Specimen Description URINE, CATHETERIZED   Final   Special Requests NONE   Final   Culture  Setup Time     Final   Value: 07/06/2013 17:41     Performed at [facility] Count     Final   Value: NO GROWTH     Performed at [facility]   Culture     Final   Value: NO GROWTH     Performed at [facility]   Report Status 07/07/2013 FINAL   Final  URINE CULTURE     Status: None   Collection Time    07/09/13  2:20 PM      Result Value Range Status   Specimen Description URINE, CATHETERIZED   Final   Special Requests  NONE   Final   Culture  Setup Time     Final   Value: 07/09/2013 20:49     Performed at [facility] Count     Final   Value: NO GROWTH     Performed at [facility]   Culture     Final   Value: NO GROWTH     Performed at [facility]   Report Status 07/10/2013 FINAL   Final  MRSA PCR SCREENING     Status: Abnormal   Collection Time    07/10/13  2:24 AM      Result Value Range Status   MRSA by PCR POSITIVE (*) NEGATIVE Final   Comment:            The GeneXpert MRSA Assay (FDA     approved for NASAL specimens     only), is one component of a     comprehensive MRSA colonization     surveillance program. It is not     intended to diagnose MRSA     infection nor to guide or     monitor treatment for     MRSA infections.     RESULT CALLED TO, READ BACK BY AND VERIFIED WITH:     A.DUNDON,RN [id number] 07/10/13 M.CAMPBELL    Studies/Results: Ct Head Wo Contrast  07/09/2013   CLINICAL DATA:  Altered mental status  EXAM: CT HEAD WITHOUT CONTRAST  TECHNIQUE: Contiguous axial images were obtained from the base of the skull through the vertex without intravenous contrast. Study was obtained within 24 hr of patient's arrival the emergency department.  COMPARISON:  July 06, 2013  FINDINGS: Moderate diffuse atrophy is stable. There is no mass, hemorrhage, extra-axial fluid collection, or midline shift. Small vessel disease in the centra semiovale bilaterally is stable. There is no new gray-white compartment lesion. No demonstrable acute infarct. Bony calvarium appears intact. Mastoid air cells are clear.  IMPRESSION: Atrophy with small vessel disease, stable. No intracranial mass, hemorrhage, or acute appearing infarct.   Electronically Signed   By: Bretta Bang M.D.   On: 07/09/2013 15:13   Ct Head Wo Contrast  07/06/2013   CLINICAL DATA:  Abdominal pain and weakness.  EXAM: CT HEAD WITHOUT CONTRAST  TECHNIQUE: Contiguous axial images were obtained from the  base of the skull through the vertex without intravenous contrast.  COMPARISON:  CT  head without contrast 08/07/2011.  FINDINGS: Atrophy and white matter disease is similar to the prior exam. No acute cortical infarct, hemorrhage, or mass lesion is present. The ventricles are of normal size. No significant extra-axial fluid collection is present.  The paranasal sinuses and mastoid air cells are clear. The osseous skull is intact.  IMPRESSION: 1. No acute intracranial abnormality. 2. Stable atrophy and diffuse white matter disease. This likely reflects the sequela of chronic microvascular ischemia.   Electronically Signed   By: Gennette Pac M.D.   On: 07/06/2013 16:04   Dg Chest Portable 1 View  07/09/2013   CLINICAL DATA:  Dementia.  Failure to thrive.  EXAM: PORTABLE CHEST - 1 VIEW  COMPARISON:  None.  FINDINGS: Cardiopericardial silhouette appears within normal limits. Basilar atelectasis. Monitoring leads project over the chest. Aortic arch atherosclerosis. No airspace disease. No pleural effusion.  IMPRESSION: No active disease.   Electronically Signed   By: Andreas Newport M.D.   On: 07/09/2013 14:59   Dg Abd Acute W/chest  07/06/2013   CLINICAL DATA:  Abdominal pain for 2-4 days, recently treated for urinary tract infection  EXAM: ACUTE ABDOMEN SERIES (ABDOMEN 2 VIEW & CHEST 1 VIEW)  COMPARISON:  CT abdomen pelvis- 05/02/2012; chest radiograph - 05/01/2012; 04/30/2012  FINDINGS: Grossly unchanged cardiac silhouette and mediastinal contours with atherosclerotic plaque within the thoracic aorta. The lungs appear hyperexpanded with flattening of the bilateral knee diaphragms a mild diffuse slightly nodular thickening of the pulmonary interstitium. There is persistent mild elevation/ eventration of the right hemidiaphragm. No focal airspace opacities. No pleural effusion or pneumothorax. No evidence of edema.  Nonobstructive bowel gas pattern. Unremarkable colonic stool burden. No pneumoperitoneum,  pneumatosis or portal venous gas.  Several phleboliths overlie the left hemipelvis. Otherwise, no definite abnormal intra-abdominal calcifications.  Mild scoliotic curvature of the thoracolumbar spine with associated multilevel DDD. Post left femoral hemi arthroplasty, incompletely evaluated.  Regional soft tissues are normal.  IMPRESSION: 1. Hyperexpanded lungs without acute cardiopulmonary disease. 2. Nonobstructive bowel gas pattern. Unremarkable colonic stool burden.   Electronically Signed   By: Simonne Come M.D.   On: 07/06/2013 15:19    Medications: Scheduled Meds: . Chlorhexidine Gluconate Cloth  6 each Topical Q0600  . ciprofloxacin  400 mg Intravenous Q12H  . hydrALAZINE  5 mg Intravenous Once  . latanoprost  1 drop Both Eyes QHS  . levothyroxine  44 mcg Intravenous Daily  . mupirocin ointment  1 application Nasal BID  . pantoprazole (PROTONIX) IV  40 mg Intravenous Q12H  . polyvinyl alcohol  2 drop Both Eyes TID  . potassium chloride  40 mEq Oral TID  . sodium chloride  3 mL Intravenous Q12H      LOS: 2 days   Dimetri Armitage M.D. [facility] 07/11/2013, 9:55 AM Pager: [phone]  If 7PM-7AM, please contact night-coverage [email or web address] Password [email or web address]

## 2013-07-11 NOTE — Progress Notes (Signed)
Thayer Ohm Farrier son called to check on patient this am. Updated on her status as of last night. He voiced understanding. This nurse took his number and informed him would call if patient became agitated or there was a change of any kind in her condition. Voiced his pleasure with the care his mother is receiving here and was very grateful for all [facility] is doing for her.  Currently she is asleep and appears to be resting comfortably.

## 2013-07-11 NOTE — Progress Notes (Signed)
Subjective: Patient lethargic this morning but arousable.  Continued eye movements but slightly improved.  EEG slow and MRI of the brain was unremarkable.    Objective: Current vital signs: BP 107/64  Pulse 60  Temp(Src) 97.5 F (36.4 C) (Oral)  Resp 18  Wt 65.227 kg (143 lb 12.8 oz)  SpO2 98% Vital signs in last 24 hours: Temp:  [97.5 F (36.4 C)] 97.5 F (36.4 C) (12/19 1343) Pulse Rate:  [60] 60 (12/19 1343) Resp:  [18] 18 (12/19 1343) BP: (105-184)/(49-154) 107/64 mmHg (12/20 0438) SpO2:  [98 %] 98 % (12/19 1343) Weight:  [65.227 kg (143 lb 12.8 oz)] 65.227 kg (143 lb 12.8 oz) (12/20 0353)  Intake/Output from previous day: 12/19 0701 - 12/20 0700 In: 3 [I.V.:3] Out: -  Intake/Output this shift:   Nutritional status: Dysphagia  Neurologic Exam: Mental Status:  Lethargic.   Cranial Nerves:  II: Discs flat bilaterally; Visual fields grossly normal, pupils equal, round, reactive to light and accommodation  III,IV, VI: ptosis not present, extra-ocular motions intact bilaterally with jerking eye movements which are horizontal but at times has a right rotary motion.  V,VII: smile symmetric, facial light touch sensation normal bilaterally  VIII: hearing normal bilaterally  IX,X: gag reflex present  XI: bilateral shoulder shrug  XII: midline tongue extension--initially was jerky but was able to control this and hold it steady  Motor:  Moves all extremities against gravity and to command  Sensory: Pinprick and light touch intact throughout, bilaterally  Deep Tendon Reflexes:  2+ throughout  Plantars:  Right: downgoing Left: downgoing  Cerebellar:  Continued tremor   Lab Results: Basic Metabolic Panel:  Recent Labs Lab 07/06/13 1220 07/09/13 1347 07/10/13 0750 07/10/13 0847 07/10/13 1253 07/11/13 0449  NA 135 132* 135  --   --  133*  K 3.2* 2.8* 2.6*  --  3.2* 2.9*  CL 98 97 101  --   --  101  CO2 25 28 25   --   --  20  GLUCOSE 97 88 88  --   --  100*  BUN  22 17 13   --   --  9  CREATININE 0.90 0.76 0.74  --   --  0.77  CALCIUM 9.1 8.6 8.3*  --   --  8.4  MG  --   --   --  1.7  --   --     Liver Function Tests:  Recent Labs Lab 07/06/13 1220 07/09/13 1347  AST 25 24  ALT 9 8  ALKPHOS 72 67  BILITOT 0.6 0.6  PROT 7.3 6.8  ALBUMIN 2.7* 2.5*    Recent Labs Lab 07/06/13 1220  LIPASE 16   No results found for this basename: AMMONIA,  in the last 168 hours  CBC:  Recent Labs Lab 07/06/13 1220 07/09/13 1347 07/10/13 0750  WBC 10.2 10.7* 8.2  NEUTROABS 6.2  --   --   HGB 13.4 14.0 13.1  HCT 40.1 40.2 37.3  MCV 99.5 97.3 96.4  PLT 244 214 211    Cardiac Enzymes:  Recent Labs Lab 07/06/13 1220  TROPONINI <0.30    Lipid Panel: No results found for this basename: CHOL, TRIG, HDL, CHOLHDL, VLDL, LDLCALC,  in the last 168 hours  CBG: No results found for this basename: GLUCAP,  in the last 168 hours  Microbiology: Results for orders placed during the hospital encounter of 07/09/13  URINE CULTURE     Status: None   Collection Time  07/09/13  2:20 PM      Result Value Range Status   Specimen Description URINE, CATHETERIZED   Final   Special Requests NONE   Final   Culture  Setup Time     Final   Value: 07/09/2013 20:49     Performed at [facility] Count     Final   Value: NO GROWTH     Performed at [facility]   Culture     Final   Value: NO GROWTH     Performed at [facility]   Report Status 07/10/2013 FINAL   Final  MRSA PCR SCREENING     Status: Abnormal   Collection Time    07/10/13  2:24 AM      Result Value Range Status   MRSA by PCR POSITIVE (*) NEGATIVE Final   Comment:            The GeneXpert MRSA Assay (FDA     approved for NASAL specimens     only), is one component of a     comprehensive MRSA colonization     surveillance program. It is not     intended to diagnose MRSA     infection nor to guide or     monitor treatment for     MRSA infections.      RESULT CALLED TO, READ BACK BY AND VERIFIED WITH:     A.DUNDON,RN [id number] 07/10/13 M.CAMPBELL    Coagulation Studies: No results found for this basename: LABPROT, INR,  in the last 72 hours  Imaging: Ct Head Wo Contrast  07/09/2013   CLINICAL DATA:  Altered mental status  EXAM: CT HEAD WITHOUT CONTRAST  TECHNIQUE: Contiguous axial images were obtained from the base of the skull through the vertex without intravenous contrast. Study was obtained within 24 hr of patient's arrival the emergency department.  COMPARISON:  July 06, 2013  FINDINGS: Moderate diffuse atrophy is stable. There is no mass, hemorrhage, extra-axial fluid collection, or midline shift. Small vessel disease in the centra semiovale bilaterally is stable. There is no new gray-white compartment lesion. No demonstrable acute infarct. Bony calvarium appears intact. Mastoid air cells are clear.  IMPRESSION: Atrophy with small vessel disease, stable. No intracranial mass, hemorrhage, or acute appearing infarct.   Electronically Signed   By: Bretta Bang M.D.   On: 07/09/2013 15:13   Mr Brain Wo Contrast  07/10/2013   EXAM: MRI HEAD WITHOUT CONTRAST  TECHNIQUE: Multiplanar, multiecho pulse sequences of the brain and surrounding structures were obtained without intravenous contrast.  COMPARISON:  Prior CT performed on 07/09/2013.  FINDINGS: Diffuse prominence of the CSF containing spaces is compatible with generalized age-related atrophy. A few scattered foci of T2/FLAIR hyperintensity seen within the periventricular and deep white matter is present, likely related to chronic microvascular ischemic disease, but very mild for patient age. No other focal parenchymal signal abnormality identified. There is no mass lesion or midline shift. Ventricles are normal in size without evidence of hydrocephalus. No extra-axial fluid collection.  No diffusion-weighted signal abnormality is identified to suggest acute intracranial infarct. Normal flow  voids are seen within the intracranial vasculature. Gray-white matter differentiation is maintained. No intracranial hemorrhage seen on the gradient echo sequence.  Craniocervical junction is grossly normal. Pituitary gland is within normal limits. Globes and optic nerves are normal.  Calvarium demonstrates a normal appearance with normal signal intensity. Upper cervical spine within normal limits.  Mild mucosal thickening noted within  both maxillary sinuses, right greater than left. Paranasal sinuses are otherwise largely clear. Scattered T2 hyperintensity noted within the mastoid air cells bilaterally.  IMPRESSION: 1. No acute intracranial infarct or other process identified. 2. Extensive age-related cerebral atrophy with mild chronic microvascular ischemic changes.   Electronically Signed   By: Rise Mu M.D.   On: 07/10/2013 23:27   Dg Chest Portable 1 View  07/09/2013   CLINICAL DATA:  Dementia.  Failure to thrive.  EXAM: PORTABLE CHEST - 1 VIEW  COMPARISON:  None.  FINDINGS: Cardiopericardial silhouette appears within normal limits. Basilar atelectasis. Monitoring leads project over the chest. Aortic arch atherosclerosis. No airspace disease. No pleural effusion.  IMPRESSION: No active disease.   Electronically Signed   By: Andreas Newport M.D.   On: 07/09/2013 14:59    Medications:  I have reviewed the patient's current medications. Scheduled: . Chlorhexidine Gluconate Cloth  6 each Topical Q0600  . ciprofloxacin  400 mg Intravenous Q12H  . hydrALAZINE  5 mg Intravenous Once  . latanoprost  1 drop Both Eyes QHS  . levothyroxine  44 mcg Intravenous Daily  . mupirocin ointment  1 application Nasal BID  . pantoprazole (PROTONIX) IV  40 mg Intravenous Q12H  . polyvinyl alcohol  2 drop Both Eyes TID  . potassium chloride  40 mEq Oral TID  . sodium chloride  3 mL Intravenous Q12H    Assessment/Plan: Patient unchanged other than lethargy.  EEG is only significant for slowing. MRI of  the brain has been reviewed and is consistent with age but shows no abnormalities to explain current presentation.    Recommendations: 1.  LP.  Patient on Lovenox.  Will D/C and attempt LP in AM.  Patient placed on SCD's. 2.  Coags in AM   LOS: 2 days   Thana Farr, MD [facility] [phone] 07/11/2013  12:49 PM

## 2013-07-12 DIAGNOSIS — H5589 Other irregular eye movements: Secondary | ICD-10-CM

## 2013-07-12 DIAGNOSIS — R5381 Other malaise: Secondary | ICD-10-CM

## 2013-07-12 LAB — PROTEIN AND GLUCOSE, CSF
Glucose, CSF: 71 mg/dL (ref 43–76)
Total  Protein, CSF: 35 mg/dL (ref 15–45)

## 2013-07-12 LAB — CSF CELL COUNT WITH DIFFERENTIAL: WBC, CSF: 9 /mm3 — ABNORMAL HIGH (ref 0–5)

## 2013-07-12 LAB — BASIC METABOLIC PANEL
CO2: 19 mEq/L (ref 19–32)
Calcium: 8.9 mg/dL (ref 8.4–10.5)
Creatinine, Ser: 0.9 mg/dL (ref 0.50–1.10)
Glucose, Bld: 113 mg/dL — ABNORMAL HIGH (ref 70–99)
Potassium: 4.1 mEq/L (ref 3.5–5.1)
Sodium: 134 mEq/L — ABNORMAL LOW (ref 135–145)

## 2013-07-12 LAB — PROTIME-INR
INR: 1.13 (ref 0.00–1.49)
Prothrombin Time: 14.3 seconds (ref 11.6–15.2)

## 2013-07-12 LAB — GRAM STAIN

## 2013-07-12 MED ORDER — LORAZEPAM 2 MG/ML IJ SOLN
0.5000 mg | Freq: Once | INTRAMUSCULAR | Status: AC
  Administered 2013-07-12: 0.5 mg via INTRAVENOUS

## 2013-07-12 MED ORDER — DILTIAZEM HCL 25 MG/5ML IV SOLN
10.0000 mg | Freq: Once | INTRAVENOUS | Status: AC
  Administered 2013-07-12: 10 mg via INTRAVENOUS
  Filled 2013-07-12 (×2): qty 5

## 2013-07-12 MED ORDER — POTASSIUM CHLORIDE 2 MEQ/ML IV SOLN
INTRAVENOUS | Status: DC
  Administered 2013-07-12: 11:00:00 via INTRAVENOUS
  Filled 2013-07-12 (×3): qty 1000

## 2013-07-12 NOTE — Progress Notes (Signed)
Patient ID: Kristina Browning  female  JYN:[id number]    DOB: [birth year]    DOA: 07/09/2013  PCP: Ruthe Mannan, MD  Assessment/Plan: Gait instability with tremor and abnormal eye movements(opsoclonus/myoclonus) - So far workup unremarkable normal B12, folate, ESR, TSH 5.2 with a normal free T4,T3 60.4, continue Synthroid  - MRI of the brain unremarkable, no acute infarct, extensive age-related cerebral atrophy - EEG nonspecific with general background slowing and difficult to differentiate continuous eye movement activity from possible epileptiform activity - spinal tap done today, studies sent by neurology -Swallow evaluation done, started on dysphagia 1 diet  Hypokalemia - Resolved, discontinued oral potassium replacement    Unspecified hypothyroidism - Place on IV Synthroid, TSH,  5.27, per pharmacy, Synthroid was recently increased to , we'll continue the current dose    Memory loss: Likely due to dementia, ? Unclear if patient also has Parkinson disease, patient's sons reported that she's been having some shuffling gait for last 2 months     Urinary tract infection, site not specified -Urine culture showed no growth, DC ciprofloxacin  DVT Prophylaxis:  Code Status:  Family Communication: Discussed in detail with patient's son, Mr Sheniece Ruggles. Does not want his mother to go back to the assisted-living facility, will need skilled nursing facility. Will place social worker consult  Disposition: Not ready    Subjective: Still confused with eye movements.  Objective: Weight change:   Intake/Output Summary (Last 24 hours) at 07/12/13 0941 Last data filed at 07/12/13 0300  Gross per 24 hour  Intake 3216.25 ml  Output      0 ml  Net 3216.25 ml   Blood pressure 152/72, pulse 110, temperature 99.6 F (37.6 C), temperature source Axillary, resp. rate 18, weight 65.227 kg (143 lb 12.8 oz), SpO2 95.00%.  Physical Exam: General: Alert and awake CVS: S1-S2 clear, no murmur  rubs or gallops Chest: CTAB Abdomen: soft NT, ND, NBS  Extremities: no c/c/e bilaterally Neuro: Jerking type movements, eye movements, moves all 4 extremities, bilateral lower extremities 3-4/5   Lab Results: Basic Metabolic Panel:  Recent Labs Lab 07/10/13 0847  07/11/13 0449 07/12/13 0557  NA  --   --  133* 134*  K  --   < > 2.9* 4.1  CL  --   --  101 101  CO2  --   --  20 19  GLUCOSE  --   --  100* 113*  BUN  --   --  9 8  CREATININE  --   --  0.77 0.90  CALCIUM  --   --  8.4 8.9  MG 1.7  --   --   --   < > = values in this interval not displayed. Liver Function Tests:  Recent Labs Lab 07/06/13 1220 07/09/13 1347  AST 25 24  ALT 9 8  ALKPHOS 72 67  BILITOT 0.6 0.6  PROT 7.3 6.8  ALBUMIN 2.7* 2.5*    Recent Labs Lab 07/06/13 1220  LIPASE 16   No results found for this basename: AMMONIA,  in the last 168 hours CBC:  Recent Labs Lab 07/06/13 1220 07/09/13 1347 07/10/13 0750  WBC 10.2 10.7* 8.2  NEUTROABS 6.2  --   --   HGB 13.4 14.0 13.1  HCT 40.1 40.2 37.3  MCV 99.5 97.3 96.4  PLT 244 214 211   Cardiac Enzymes:  Recent Labs Lab 07/06/13 1220  TROPONINI <0.30   BNP: No components found with this basename: POCBNP,  CBG: No  results found for this basename: GLUCAP,  in the last 168 hours   Micro Results: Recent Results (from the past 240 hour(s))  URINE CULTURE     Status: None   Collection Time    07/06/13 12:34 PM      Result Value Range Status   Specimen Description URINE, CATHETERIZED   Final   Special Requests NONE   Final   Culture  Setup Time     Final   Value: 07/06/2013 17:41     Performed at [facility] Count     Final   Value: NO GROWTH     Performed at [facility]   Culture     Final   Value: NO GROWTH     Performed at [facility]   Report Status 07/07/2013 FINAL   Final  URINE CULTURE     Status: None   Collection Time    07/09/13  2:20 PM      Result Value Range Status    Specimen Description URINE, CATHETERIZED   Final   Special Requests NONE   Final   Culture  Setup Time     Final   Value: 07/09/2013 20:49     Performed at [facility] Count     Final   Value: NO GROWTH     Performed at [facility]   Culture     Final   Value: NO GROWTH     Performed at [facility]   Report Status 07/10/2013 FINAL   Final  MRSA PCR SCREENING     Status: Abnormal   Collection Time    07/10/13  2:24 AM      Result Value Range Status   MRSA by PCR POSITIVE (*) NEGATIVE Final   Comment:            The GeneXpert MRSA Assay (FDA     approved for NASAL specimens     only), is one component of a     comprehensive MRSA colonization     surveillance program. It is not     intended to diagnose MRSA     infection nor to guide or     monitor treatment for     MRSA infections.     RESULT CALLED TO, READ BACK BY AND VERIFIED WITH:     A.DUNDON,RN 1610 07/10/13 M.CAMPBELL  GRAM STAIN     Status: None   Collection Time    07/12/13  8:47 AM      Result Value Range Status   Specimen Description CSF   Final   Special Requests NONE   Final   Gram Stain     Final   Value: CYTOSPIN SLIDE     WBC PRESENT, PREDOMINANTLY MONONUCLEAR     NO ORGANISMS SEEN   Report Status 07/12/2013 FINAL   Final    Studies/Results: Ct Head Wo Contrast  07/09/2013   CLINICAL DATA:  Altered mental status  EXAM: CT HEAD WITHOUT CONTRAST  TECHNIQUE: Contiguous axial images were obtained from the base of the skull through the vertex without intravenous contrast. Study was obtained within 24 hr of patient's arrival the emergency department.  COMPARISON:  July 06, 2013  FINDINGS: Moderate diffuse atrophy is stable. There is no mass, hemorrhage, extra-axial fluid collection, or midline shift. Small vessel disease in the centra semiovale bilaterally is stable. There is no new gray-white compartment lesion. No demonstrable acute infarct. Bony  calvarium appears intact.  Mastoid air cells are clear.  IMPRESSION: Atrophy with small vessel disease, stable. No intracranial mass, hemorrhage, or acute appearing infarct.   Electronically Signed   By: Bretta Bang M.D.   On: 07/09/2013 15:13   Ct Head Wo Contrast  07/06/2013   CLINICAL DATA:  Abdominal pain and weakness.  EXAM: CT HEAD WITHOUT CONTRAST  TECHNIQUE: Contiguous axial images were obtained from the base of the skull through the vertex without intravenous contrast.  COMPARISON:  CT head without contrast 08/07/2011.  FINDINGS: Atrophy and white matter disease is similar to the prior exam. No acute cortical infarct, hemorrhage, or mass lesion is present. The ventricles are of normal size. No significant extra-axial fluid collection is present.  The paranasal sinuses and mastoid air cells are clear. The osseous skull is intact.  IMPRESSION: 1. No acute intracranial abnormality. 2. Stable atrophy and diffuse white matter disease. This likely reflects the sequela of chronic microvascular ischemia.   Electronically Signed   By: Gennette Pac M.D.   On: 07/06/2013 16:04   Dg Chest Portable 1 View  07/09/2013   CLINICAL DATA:  Dementia.  Failure to thrive.  EXAM: PORTABLE CHEST - 1 VIEW  COMPARISON:  None.  FINDINGS: Cardiopericardial silhouette appears within normal limits. Basilar atelectasis. Monitoring leads project over the chest. Aortic arch atherosclerosis. No airspace disease. No pleural effusion.  IMPRESSION: No active disease.   Electronically Signed   By: Andreas Newport M.D.   On: 07/09/2013 14:59   Dg Abd Acute W/chest  07/06/2013   CLINICAL DATA:  Abdominal pain for 2-4 days, recently treated for urinary tract infection  EXAM: ACUTE ABDOMEN SERIES (ABDOMEN 2 VIEW & CHEST 1 VIEW)  COMPARISON:  CT abdomen pelvis- 05/02/2012; chest radiograph - 05/01/2012; 04/30/2012  FINDINGS: Grossly unchanged cardiac silhouette and mediastinal contours with atherosclerotic plaque within the thoracic aorta. The lungs  appear hyperexpanded with flattening of the bilateral knee diaphragms a mild diffuse slightly nodular thickening of the pulmonary interstitium. There is persistent mild elevation/ eventration of the right hemidiaphragm. No focal airspace opacities. No pleural effusion or pneumothorax. No evidence of edema.  Nonobstructive bowel gas pattern. Unremarkable colonic stool burden. No pneumoperitoneum, pneumatosis or portal venous gas.  Several phleboliths overlie the left hemipelvis. Otherwise, no definite abnormal intra-abdominal calcifications.  Mild scoliotic curvature of the thoracolumbar spine with associated multilevel DDD. Post left femoral hemi arthroplasty, incompletely evaluated.  Regional soft tissues are normal.  IMPRESSION: 1. Hyperexpanded lungs without acute cardiopulmonary disease. 2. Nonobstructive bowel gas pattern. Unremarkable colonic stool burden.   Electronically Signed   By: Simonne Come M.D.   On: 07/06/2013 15:19    Medications: Scheduled Meds: . Chlorhexidine Gluconate Cloth  6 each Topical Q0600  . ciprofloxacin  400 mg Intravenous Q12H  . hydrALAZINE  5 mg Intravenous Once  . latanoprost  1 drop Both Eyes QHS  . levothyroxine  44 mcg Intravenous Daily  . mupirocin ointment  1 application Nasal BID  . pantoprazole (PROTONIX) IV  40 mg Intravenous Q12H  . polyvinyl alcohol  2 drop Both Eyes TID  . sodium chloride  3 mL Intravenous Q12H      LOS: 3 days   RAI,RIPUDEEP M.D. [facility] 07/12/2013, 9:41 AM Pager: [phone]  If 7PM-7AM, please contact night-coverage [email or web address] Password [email or web address]

## 2013-07-12 NOTE — Progress Notes (Signed)
At 1900 RN notified by CMT that patient had converted to AFIB/RVR.  Dr. Isidoro Donning notified, EKG obtained to confirm and order for 10mg  IV Cardizem bolus received.  Dr. Isidoro Donning stated to page Oncall PA/NP to assess patient.

## 2013-07-12 NOTE — Procedures (Addendum)
LP Procedure Note:  Patient has been seen and examined.  Patient unchanged today.  Work up to date has been unremarkable.  Chart has been reviewed.  LP is being performed to rule out infection.  Procedure has been explained to patient/family including risks and benefits.  Consent has been signed by son and witnessed.   Blood pressure 152/72, pulse 110, temperature 99.6 F (37.6 C), temperature source Axillary, resp. rate 18, weight 65.227 kg (143 lb 12.8 oz), SpO2 95.00%.  Current facility-administered medications:acetaminophen (TYLENOL) suppository 650 mg, 650 mg, Rectal, Q6H PRN, Adeline C Viyuoh, MD;  acetaminophen (TYLENOL) tablet 650 mg, 650 mg, Oral, Q6H PRN, Kela Millin, MD;  Chlorhexidine Gluconate Cloth 2 % PADS 6 each, 6 each, Topical, Q0600, Ripudeep Jenna Luo, MD, 6 each at 07/10/13 0631;  ciprofloxacin (CIPRO) IVPB 400 mg, 400 mg, Intravenous, Q12H, Adeline C Viyuoh, MD, 400 mg at 07/11/13 2052 dextrose 5 % and 0.45% NaCl 1,000 mL with potassium chloride 40 mEq infusion, , Intravenous, Continuous, Ripudeep K Rai, MD, Last Rate: 75 mL/hr at 07/11/13 2053;  food thickener (THICK IT) powder, , Oral, PRN, Ripudeep K Rai, MD;  hydrALAZINE (APRESOLINE) injection 5 mg, 5 mg, Intravenous, Once, Jinger Neighbors, NP;  latanoprost (XALATAN) 0.005 % ophthalmic solution 1 drop, 1 drop, Both Eyes, QHS, Adeline C Viyuoh, MD, 1 drop at 07/11/13 2057 levothyroxine (SYNTHROID, LEVOTHROID) injection 44 mcg, 44 mcg, Intravenous, Daily, Ripudeep K Rai, MD, 44 mcg at 07/11/13 1238;  LORazepam (ATIVAN) injection 0.5 mg, 0.5 mg, Intravenous, BID PRN, Kela Millin, MD, 0.5 mg at 07/11/13 0325;  mupirocin ointment (BACTROBAN) 2 % 1 application, 1 application, Nasal, BID, Ripudeep K Rai, MD, 1 application at 07/11/13 2052 ondansetron (ZOFRAN) injection 4 mg, 4 mg, Intravenous, Q6H PRN, Adeline C Viyuoh, MD;  ondansetron (ZOFRAN) tablet 4 mg, 4 mg, Oral, Q6H PRN, Adeline C Viyuoh, MD;  pantoprazole (PROTONIX) injection  40 mg, 40 mg, Intravenous, Q12H, Adeline C Viyuoh, MD, 40 mg at 07/11/13 2356;  polyvinyl alcohol (LIQUIFILM TEARS) 1.4 % ophthalmic solution 2 drop, 2 drop, Both Eyes, TID, Adeline C Viyuoh, MD, 2 drop at 07/11/13 2058 potassium chloride 20 MEQ/15ML (10%) liquid 40 mEq, 40 mEq, Oral, TID, Ripudeep K Rai, MD, 40 mEq at 07/11/13 2107;  sodium chloride 0.9 % injection 3 mL, 3 mL, Intravenous, Q12H, Adeline C Viyuoh, MD, 3 mL at 07/11/13 1239   Recent Labs  07/09/13 1347 07/10/13 0750 07/12/13 0557  WBC 10.7* 8.2  --   HGB 14.0 13.1  --   HCT 40.2 37.3  --   PLT 214 211  --   INR  --   --  1.13    CT of head (07/09/2013):  Atrophy with small vessel disease, stable. No intracranial mass, hemorrhage, or acute appearing infarct   Patient was placed in the lateral decub/sitting position.  Area was cleaned with betadine and anesthetized with lidocaine.  Under sterile conditions 20G LP needle was placed at approximately L3-4 without difficulty.  Opening pressure was unable to be measured secondary to cooperation of patient.  Approximately 18cc of initially blood-tinged fluid was obtained and sent for studies.  No complications were noted.    Work up in progress to rule out infection or post-infectious process.  May also be paraneoplastic as well and patient will need a primary work up.    Thana Farr, MD [facility] [phone] 07/12/2013  8:37 AM

## 2013-07-12 NOTE — Clinical Social Work Placement (Signed)
Clinical Social Work Department CLINICAL SOCIAL WORK PLACEMENT NOTE 07/12/2013  Patient:  Kristina Browning, Kristina Browning  Account Number:  [id number] Admit date:  07/09/2013  Clinical Social Worker:  Jetta Lout, Theresia Majors  Date/time:  07/10/2013 05:14 PM  Clinical Social Work is seeking post-discharge placement for this patient at the following level of care:   SKILLED NURSING   (*CSW will update this form in Epic as items are completed)   07/10/2013  Patient/family provided with [facility] Department of Clinical Social Work's list of facilities offering this level of care within the geographic area requested by the patient (or if unable, by the patient's family).  07/10/2013  Patient/family informed of their freedom to choose among providers that offer the needed level of care, that participate in Medicare, Medicaid or managed care program needed by the patient, have an available bed and are willing to accept the patient.  07/10/2013  Patient/family informed of [facility]' ownership interest in [facility], as well as of the fact that they are under no obligation to receive care at this facility.  PASARR submitted to EDS on 09/11/2012 PASARR number received from [employer] on 09/11/2012  FL2 transmitted to all facilities in geographic area requested by pt/family on  07/10/2013 FL2 transmitted to all facilities within larger geographic area on   Patient informed that his/her managed care company has contracts with or will negotiate with  certain facilities, including the following:     Patient/family informed of bed offers received:  07/12/2013 Patient chooses bed at  Physician recommends and patient chooses bed at    Patient to be transferred to  on   Patient to be transferred to facility by   The following physician request were entered in Epic:   Additional Comments:   Roddie Mc, Bryon Lions, [phone]

## 2013-07-13 ENCOUNTER — Inpatient Hospital Stay (HOSPITAL_COMMUNITY): Payer: Medicare Other

## 2013-07-13 DIAGNOSIS — R0600 Dyspnea, unspecified: Secondary | ICD-10-CM

## 2013-07-13 DIAGNOSIS — R531 Weakness: Secondary | ICD-10-CM

## 2013-07-13 DIAGNOSIS — R451 Restlessness and agitation: Secondary | ICD-10-CM

## 2013-07-13 DIAGNOSIS — Z515 Encounter for palliative care: Secondary | ICD-10-CM

## 2013-07-13 LAB — VITAMIN B1: Vitamin B1 (Thiamine): <7 nmol/L — ABNORMAL LOW (ref 8–30)

## 2013-07-13 LAB — BASIC METABOLIC PANEL
Calcium: 8.2 mg/dL — ABNORMAL LOW (ref 8.4–10.5)
Chloride: 99 mEq/L (ref 96–112)
Creatinine, Ser: 0.81 mg/dL (ref 0.50–1.10)
GFR calc Af Amer: 75 mL/min — ABNORMAL LOW (ref >90)

## 2013-07-13 LAB — PATHOLOGIST SMEAR REVIEW

## 2013-07-13 MED ORDER — DILTIAZEM HCL 100 MG IV SOLR
5.0000 mg/h | INTRAVENOUS | Status: DC
  Administered 2013-07-13 (×2): 5 mg/h via INTRAVENOUS
  Filled 2013-07-13 (×2): qty 100

## 2013-07-13 MED ORDER — LORAZEPAM 2 MG/ML IJ SOLN
1.0000 mg | Freq: Four times a day (QID) | INTRAMUSCULAR | Status: DC
  Administered 2013-07-13 – 2013-07-14 (×3): 1 mg via INTRAVENOUS
  Administered 2013-07-14: 10:00:00 via INTRAVENOUS
  Filled 2013-07-13 (×4): qty 1

## 2013-07-13 MED ORDER — MORPHINE SULFATE 2 MG/ML IJ SOLN
1.0000 mg | INTRAMUSCULAR | Status: DC | PRN
  Administered 2013-07-13 – 2013-07-14 (×3): 1 mg via INTRAVENOUS
  Filled 2013-07-13 (×3): qty 1

## 2013-07-13 MED ORDER — SCOPOLAMINE 1 MG/3DAYS TD PT72
1.0000 | MEDICATED_PATCH | TRANSDERMAL | Status: DC
  Administered 2013-07-13: 1.5 mg via TRANSDERMAL
  Filled 2013-07-13: qty 1

## 2013-07-13 MED ORDER — TOBRAMYCIN 0.3 % OP OINT
TOPICAL_OINTMENT | Freq: Three times a day (TID) | OPHTHALMIC | Status: DC
  Administered 2013-07-13 – 2013-07-14 (×3): via OPHTHALMIC
  Filled 2013-07-13: qty 3.5

## 2013-07-13 MED ORDER — LEVOFLOXACIN IN D5W 750 MG/150ML IV SOLN
750.0000 mg | INTRAVENOUS | Status: DC
  Filled 2013-07-13: qty 150

## 2013-07-13 NOTE — Consult Note (Signed)
Patient ZO:[id number] Browning      DOB: 11/08/1925      UJW:[id number]     Consult Note from the Palliative Medicine Team at [facility]    Consult Requested by: Dr Isidoro Donning     PCP: Ruthe Mannan, MD Reason for Consultation:Clarification of GOc and options     Phone [phone]  Assessment of patients Current state:  Kristina Browning is an 79 y.o. female who lives in a AL ([place])  She has a history of Dementia.  She was admitted with significant physical and functional decline.  She has had an aggressive work up to treat the treatable but today family verbalize their only desire for their mother is comfort, quality and dignity.  They desire no life prolonging diagnostics or treatments.  Shift to full comfort focusing on quality and dignity   Consult is for review of medical treatment options, clarification of goals of care and end of life issues, disposition and options, and symptom recommendation.  This NP Lorinda Creed reviewed medical records, received report from team, assessed the patient and then meet at the patient's bedside along with her two sons Kea Callan # [phone], Jamiracle Avants # [phone]  to discuss diagnosis prognosis, GOC, EOL wishes disposition and options.   A detailed discussion was had today regarding advanced directives.  Concepts specific to code status, artifical feeding and hydration, continued IV antibiotics and rehospitalization was had.  The difference between a aggressive medical intervention path  and a palliative comfort care path for this patient at this time was had.  Values and goals of care important to patient and family were attempted to be elicited.  Concept of Hospice and Palliative Care were discussed  Natural trajectory and expectations at EOL were discussed.  Questions and concerns addressed.  Hard Choices booklet left for review. Family encouraged to call with questions or concerns.  PMT will continue to support  holistically.      Goals of Care: 1.  Code Status: DNR/DNI-comfort is main focus of care   2. Scope of Treatment: 1. Vital Signs:daily  2. Respiratory/Oxygen:for comfort only 3. Nutritional Support/Tube Feeds:no artifical feeding now or in the future 4. Antibiotics: none 5. Blood Products:none 6. VOZ:DGUY 7. Review of Medications to be discontinued:minimize for comfort 8. Labs:none 9. Telemetry:none 10. Consults:none   4. Disposition:Dependant on outcomes   3. Symptom Management:   1. Anxiety/Agitation:  Ativan 1 mg IV every 6 hr scheduled 2. Pain: Morphine 1 mg IV every 2 hrs prn 3. Terminal Secretions: Scopolamine patch as directed  4. Psychosocial:  Emotional support to both sons present.  They feel very comfortable with the decision for full comfort, knowing that this is exactly what  the patient verbalized as her wishes.  5. Spiritual: Denies chaplin services    Patient Documents Completed or Given: Document Given Completed  Advanced Directives Pkt    MOST yes   DNR    Gone from My Sight    Hard Choices yes     Brief HPI: Kristina Browning is an 79 y.o. female who lives in a [place] ([place])  She has a history of Dementia.  She was admitted with significant physical and functional decline.  She has had an aggressive work up to treat the treatable but today family verbalize their only desire for their mother is comfort, quality and dignity.  They desire no life prolonging diagnostics or treatments.  Shift to full comfort    ROS:  unable to illicit  due to altered mental status,     PMH:  Past Medical History  Diagnosis Date  . Dementia   . Thyroid disease   . Hyperthyroidism   . GERD (gastroesophageal reflux disease)      PSH: Past Surgical History  Procedure Laterality Date  . Total hip arthroplasty  04/2012    LEFT TOTAL HIP  . Abdominal hysterectomy    . Hip arthroplasty  04/30/2012    Procedure: ARTHROPLASTY BIPOLAR HIP;  Surgeon: Harvie Junior, MD;  Location: [facility] OR;  Service: Orthopedics;  Laterality: Left;   I have reviewed the FH and SH and  If appropriate update it with new information. Allergies  Allergen Reactions  . Celexa [Citalopram Hydrobromide] Other (See Comments)    Per MAR  . Exelon [Rivastigmine] Other (See Comments)    Per MAR  . Sulfonamide Derivatives Hives    unsure  . Penicillins Rash    Per MAR   Scheduled Meds: . Chlorhexidine Gluconate Cloth  6 each Topical Q0600  . hydrALAZINE  5 mg Intravenous Once  . latanoprost  1 drop Both Eyes QHS  . levothyroxine  44 mcg Intravenous Daily  . mupirocin ointment  1 application Nasal BID  . pantoprazole (PROTONIX) IV  40 mg Intravenous Q12H  . polyvinyl alcohol  2 drop Both Eyes TID  . sodium chloride  3 mL Intravenous Q12H   Continuous Infusions: . diltiazem (CARDIZEM) infusion 5 mg/hr (07/13/13 1401)   PRN Meds:.acetaminophen, acetaminophen, food thickener, LORazepam, ondansetron (ZOFRAN) IV, ondansetron    BP 115/65  Pulse 92  Temp(Src) 100 F (37.8 C) (Axillary)  Resp 21  Wt 65.227 kg (143 lb 12.8 oz)  SpO2 93%   PPS:30 % at best  No intake or output data in the 24 hours ending 07/13/13 1457  Physical Exam:  General:  Ill appearing, generally uncomfortable sappearing HEENT:  L eye pink and irritated, dry buccal membranes, continuous rapid eye movements Chest:   Decreased in bases, scattered coarse BS, audible respiration CVS: RRR, no murmur noted Abdomen:sodt NT +BS Ext: without edema Neuro: unable to follow commands  Labs: CBC    Component Value Date/Time   WBC 8.2 07/10/2013 0750   RBC 3.87 07/10/2013 0750   HGB 13.1 07/10/2013 0750   HCT 37.3 07/10/2013 0750   PLT 211 07/10/2013 0750   MCV 96.4 07/10/2013 0750   MCH 33.9 07/10/2013 0750   MCHC 35.1 07/10/2013 0750   RDW 13.9 07/10/2013 0750   LYMPHSABS 2.5 07/06/2013 1220   MONOABS 1.3* 07/06/2013 1220   EOSABS 0.2 07/06/2013 1220   BASOSABS 0.1 07/06/2013 1220     BMET    Component Value Date/Time   NA 128* 07/13/2013 0530   K 3.4* 07/13/2013 0530   CL 99 07/13/2013 0530   CO2 19 07/13/2013 0530   GLUCOSE 125* 07/13/2013 0530   BUN 10 07/13/2013 0530   CREATININE 0.81 07/13/2013 0530   CALCIUM 8.2* 07/13/2013 0530   GFRNONAA 64* 07/13/2013 0530   GFRAA 75* 07/13/2013 0530    CMP     Component Value Date/Time   NA 128* 07/13/2013 0530   K 3.4* 07/13/2013 0530   CL 99 07/13/2013 0530   CO2 19 07/13/2013 0530   GLUCOSE 125* 07/13/2013 0530   BUN 10 07/13/2013 0530   CREATININE 0.81 07/13/2013 0530   CALCIUM 8.2* 07/13/2013 0530   PROT 6.8 07/09/2013 1347   ALBUMIN 2.5* 07/09/2013 1347   AST 24 07/09/2013 1347  ALT 8 07/09/2013 1347   ALKPHOS 67 07/09/2013 1347   BILITOT 0.6 07/09/2013 1347   GFRNONAA 64* 07/13/2013 0530   GFRAA 75* 07/13/2013 0530    Time In Time Out Total Time Spent with Patient Total Overall Time  1415 1530 70 min 75 min    Greater than 50%  of this time was spent counseling and coordinating care related to the above assessment and plan.  Lorinda Creed NP  Palliative Medicine Team Team Phone # [phone] Pager [phone]   Discussed with Dr Isidoro Donning

## 2013-07-13 NOTE — Progress Notes (Signed)
NEURO HOSPITALIST PROGRESS NOTE   SUBJECTIVE:                                                                                                                        Patient remains to show nystagmus, jerking eye movements, is very agitated when moved.   OBJECTIVE:                                                                                                                           Vital signs in last 24 hours: Temp:  [100 F (37.8 C)-100.9 F (38.3 C)] 100 F (37.8 C) (12/22 1402) Pulse Rate:  [71-92] 92 (12/22 1402) Resp:  [21] 21 (12/21 2038) BP: (114-115)/(65-71) 115/65 mmHg (12/22 1402) SpO2:  [93 %-95 %] 93 % (12/22 1402)  Intake/Output from previous day: 12/21 0701 - 12/22 0700 In: 240 [P.O.:240] Out: -  Intake/Output this shift:   Nutritional status:    Past Medical History  Diagnosis Date  . Dementia   . Thyroid disease   . Hyperthyroidism   . GERD (gastroesophageal reflux disease)      Neurologic Exam:  Mental Status: Alert, was recently repositioned by staff and gripping bed and very tremulous.  Cranial Nerves: II: blinks to threat bilaterally, pupils equal, round, reactive to light and accommodation III,IV, VI:EOM shows vertical nystagmus and at times rotary turning to the right.  Able to look in all directions with jerking eye movements.  V,VII: smile symmetric, facial light touch sensation normal bilaterally VIII: hearing normal bilaterally IX,X: gag reflex present XI: bilateral shoulder shrug XII: midline tongue extension with continued jerking motions which can be controlled.   Motor: Moves all extremities to command and antigravity.  Whole body is tremulous if patient is moved or head is turned.   Sensory: Pinprick and light touch intact throughout, bilaterally Deep Tendon Reflexes:  2+ thrhoughout  Plantars: Right: downgoing   Left: downgoing     Lab Results: No results found for this basename: cbc,  bmp, coags, chol, tri, ldl, hga1c   Lipid Panel No results found for this basename: CHOL, TRIG, HDL, CHOLHDL, VLDL, LDLCALC,  in the last 72 hours  Studies/Results: Dg Chest Port 1 View  07/13/2013   CLINICAL DATA:  Possible aspiration.  EXAM: PORTABLE CHEST - 1 VIEW  COMPARISON:  07/09/2013  FINDINGS: The cardiomediastinal silhouette is within normal limits. The lungs are slightly hypoinflated with minimal streaky bibasilar opacity. No segmental airspace consolidation, edema, or pleural effusion is identified. No acute osseous abnormality is identified.  IMPRESSION: Minimal streaky bibasilar opacity, likely atelectasis although aspiration is not completely excluded.   Electronically Signed   By: Sebastian Ache   On: 07/13/2013 13:17    MEDICATIONS                                                                                                                        Scheduled: . Chlorhexidine Gluconate Cloth  6 each Topical Q0600  . hydrALAZINE  5 mg Intravenous Once  . latanoprost  1 drop Both Eyes QHS  . levothyroxine  44 mcg Intravenous Daily  . mupirocin ointment  1 application Nasal BID  . pantoprazole (PROTONIX) IV  40 mg Intravenous Q12H  . polyvinyl alcohol  2 drop Both Eyes TID  . sodium chloride  3 mL Intravenous Q12H   CSF findings: Culture showed no growth Protein/ glucose: 35/71 Tube 1 pink, cloudy, RBC 5060, WBC 9, no lymph, no neutrophils, no eosinophils JC virus --pending    Felicie Morn PA-C [facility] [phone]  07/13/2013, 2:37 PM   Patient seen and examined.  Clinical course and management discussed.  Necessary edits performed.  I agree with the above.  Assessment and plan of care developed and discussed below.    ASSESSMENT/PLAN:                                                                                                            PAtient remains unchanged. When moved she is very vertiginous and showing jerking eye movements.  As noted  prior, EEG is only significant for slowing. MRI of the brain has been reviewed and is consistent with age but shows no abnormalities to explain current presentation. LP findings shows no growth on culture, normal protein and glucose.  RBC 5060 and WBC 9.  Post-infectious etiology remains likely.  Would like to rule out paraneoplastic as well.  Family at this point would not like any further work up.  Not interested in a trial of IVIg either.  Requesting comfort care.    No further neurologic intervention is recommended at this time.  If further questions arise, please call or page at that time.  Thank you for allowing neurology to participate in the care of this patient.   Thana Farr,  MD [facility] [phone]  07/13/2013  6:40 PM

## 2013-07-13 NOTE — Progress Notes (Signed)
Speech Language Pathology Treatment: Dysphagia  Patient Details Name: Kristina Browning MRN: [id number] DOB: 1925-10-24 Today's Date: 07/13/2013 Time: 4098-1191 SLP Time Calculation (min): 39 min  Assessment / Plan / Recommendation Clinical Impression  Treatment focused on continued diagnostic assessment of swallow function and education with son.  She continues with significant dizziness/tremors although mildly improved since 12/19.  Pt. Able to tolerate head of bed to approximately 55 degrees witjh max encouragement.  Indications of worsening pharyngeal dysphagia with penetration/aspiration suspected.   Etiology of symptoms unknown and pt. Unable to sit at 90 degrees for MBS.  SLP recommends NPO status.  SLP will follow for po trials when appropriate versus MBS if able versus comfort feeds.  Family meeting with Palliative Care today.   HPI HPI: 79 y.o. female from and ALF in [place] with h/o Dementia, hypothyroidism, GERD who presents with worsening functional decline and abnl eye mov'ts. The history is obtained from sons at bedside- they report that over a period of one week pt lost her mobility, unable to feed herself, and her facial expression and speech pattern have all changed markedly. They also report abnl eye mov'ts. Pt has also had fevers per family, no cough, dairrhea or abd pain reported. She had N/V and reported dizziness as well. No abd pain reported. In the ED CT of head neg for acute findings and UA c/w UTI, K 2.8, Na 132.  CXR No active disease.    Pertinent Vitals WL  SLP Plan  Continue with current plan of care    Recommendations Diet recommendations: NPO              Oral Care Recommendations: Oral care BID Follow up Recommendations: Skilled Nursing facility (TBD) Plan: Continue with current plan of care    GO     Royce Macadamia M.Ed [employer] [phone]  07/13/2013

## 2013-07-13 NOTE — Progress Notes (Signed)
Patient ID: Kristina Browning  female  ZOX:[id number]    DOB: Jan 25, 1926    DOA: 07/09/2013  PCP: Ruthe Mannan, MD  Assessment/Plan: Gait instability with tremor and abnormal eye movements(opsoclonus/myoclonus)- worsening, patient goes into myoclonic jerks with minimal touching - So far workup unremarkable normal B12, folate, ESR, TSH 5.2 with a normal free T4,T3 60.4, continue Synthroid  - MRI of the brain unremarkable, no acute infarct, extensive age-related cerebral atrophy - EEG nonspecific with general background slowing and difficult to differentiate continuous eye movement activity from possible epileptiform activity - spinal tap done, studies negative so far, CMV, EBV, RSV, JCV, B Burgdorfi pending -Discussed with Dr. Thad Ranger this morning, mentioned possibly could be a paraneoplastic syndrome and recommended CT of the abdomen and pelvis, chest to rule out any primary tumor. May benefit from IVIG.    Aspiration: - Patient appears to be aspirating with current diet, chest x-ray showed minimal streaky bibasilar opacities likely atelectasis versus aspiration, started on Levaquin, NPO    Unspecified hypothyroidism - on IV Synthroid, TSH,  5.27, per pharmacy, Synthroid was recently increased to , we'll continue the current dose    Memory loss: Likely due to dementia, ? Unclear if patient also has Parkinson disease, patient's sons reported that she's been having some shuffling gait for last 2 months     Urinary tract infection, site not specified -Urine culture showed no growth, DC'd ciprofloxacin  DVT Prophylaxis:  Code Status:  Family Communication: Discussed in detail with patient's son, Mr Kristina Browning. Patient's sons have been very supportive and Mr Kristina Browning stated that per his mother's wishes she did not want any artificial feeding or prolong her suffering, she is DO NOT RESUSCITATE status and he wants her to be comfortable. I called for palliative medicine consultation to  assist with  Disposition: Not ready    Subjective: Still confused with eye movements, Having myoclonic jerks with minimal touching, moaning  Objective: Weight change:  No intake or output data in the 24 hours ending 07/13/13 1514 Blood pressure 115/65, pulse 92, temperature 100 F (37.8 C), temperature source Axillary, resp. rate 21, weight 65.227 kg (143 lb 12.8 oz), SpO2 93.00%.  Physical Exam: General:  confused, myoclonic jerks  CVS: S1-S2 clear, no murmur rubs or gallops Chest: CTAB Abdomen: soft NT, ND, NBS  Extremities: no c/c/e bilaterally Neuro: Jerking type movements, eye movements  Lab Results: Basic Metabolic Panel:  Recent Labs Lab 07/10/13 0847  07/12/13 0557 07/13/13 0530  NA  --   < > 134* 128*  K  --   < > 4.1 3.4*  CL  --   < > 101 99  CO2  --   < > 19 19  GLUCOSE  --   < > 113* 125*  BUN  --   < > 8 10  CREATININE  --   < > 0.90 0.81  CALCIUM  --   < > 8.9 8.2*  MG 1.7  --   --   --   < > = values in this interval not displayed. Liver Function Tests:  Recent Labs Lab 07/09/13 1347  AST 24  ALT 8  ALKPHOS 67  BILITOT 0.6  PROT 6.8  ALBUMIN 2.5*   No results found for this basename: LIPASE, AMYLASE,  in the last 168 hours No results found for this basename: AMMONIA,  in the last 168 hours CBC:  Recent Labs Lab 07/09/13 1347 07/10/13 0750  WBC 10.7* 8.2  HGB 14.0 13.1  HCT 40.2 37.3  MCV 97.3 96.4  PLT 214 211   Cardiac Enzymes: No results found for this basename: CKTOTAL, CKMB, CKMBINDEX, TROPONINI,  in the last 168 hours BNP: No components found with this basename: POCBNP,  CBG: No results found for this basename: GLUCAP,  in the last 168 hours   Micro Results: Recent Results (from the past 240 hour(s))  URINE CULTURE     Status: None   Collection Time    07/06/13 12:34 PM      Result Value Range Status   Specimen Description URINE, CATHETERIZED   Final   Special Requests NONE   Final   Culture  Setup Time     Final    Value: 07/06/2013 17:41     Performed at [facility] Count     Final   Value: NO GROWTH     Performed at [facility]   Culture     Final   Value: NO GROWTH     Performed at [facility]   Report Status 07/07/2013 FINAL   Final  URINE CULTURE     Status: None   Collection Time    07/09/13  2:20 PM      Result Value Range Status   Specimen Description URINE, CATHETERIZED   Final   Special Requests NONE   Final   Culture  Setup Time     Final   Value: 07/09/2013 20:49     Performed at [facility] Count     Final   Value: NO GROWTH     Performed at [facility]   Culture     Final   Value: NO GROWTH     Performed at [facility]   Report Status 07/10/2013 FINAL   Final  MRSA PCR SCREENING     Status: Abnormal   Collection Time    07/10/13  2:24 AM      Result Value Range Status   MRSA by PCR POSITIVE (*) NEGATIVE Final   Comment:            The GeneXpert MRSA Assay (FDA     approved for NASAL specimens     only), is one component of a     comprehensive MRSA colonization     surveillance program. It is not     intended to diagnose MRSA     infection nor to guide or     monitor treatment for     MRSA infections.     RESULT CALLED TO, READ BACK BY AND VERIFIED WITH:     A.DUNDON,RN 1191 07/10/13 M.CAMPBELL  GRAM STAIN     Status: None   Collection Time    07/12/13  8:47 AM      Result Value Range Status   Specimen Description CSF   Final   Special Requests NONE   Final   Gram Stain     Final   Value: CYTOSPIN SLIDE     WBC PRESENT, PREDOMINANTLY MONONUCLEAR     NO ORGANISMS SEEN   Report Status 07/12/2013 FINAL   Final  CSF CULTURE     Status: None   Collection Time    07/12/13  8:47 AM      Result Value Range Status   Specimen Description CSF   Final   Special Requests NONE   Final   Gram Stain     Final   Value: CYTOSPIN SLIDE  WBC PRESENT, PREDOMINANTLY MONONUCLEAR     NO ORGANISMS SEEN      Performed at [facility]     Performed at [facility]   Culture     Final   Value: NO GROWTH 1 DAY     Performed at [facility]   Report Status PENDING   Incomplete    Studies/Results: Ct Head Wo Contrast  07/09/2013   CLINICAL DATA:  Altered mental status  EXAM: CT HEAD WITHOUT CONTRAST  TECHNIQUE: Contiguous axial images were obtained from the base of the skull through the vertex without intravenous contrast. Study was obtained within 24 hr of patient's arrival the emergency department.  COMPARISON:  July 06, 2013  FINDINGS: Moderate diffuse atrophy is stable. There is no mass, hemorrhage, extra-axial fluid collection, or midline shift. Small vessel disease in the centra semiovale bilaterally is stable. There is no new gray-white compartment lesion. No demonstrable acute infarct. Bony calvarium appears intact. Mastoid air cells are clear.  IMPRESSION: Atrophy with small vessel disease, stable. No intracranial mass, hemorrhage, or acute appearing infarct.   Electronically Signed   By: Bretta Bang M.D.   On: 07/09/2013 15:13   Ct Head Wo Contrast  07/06/2013   CLINICAL DATA:  Abdominal pain and weakness.  EXAM: CT HEAD WITHOUT CONTRAST  TECHNIQUE: Contiguous axial images were obtained from the base of the skull through the vertex without intravenous contrast.  COMPARISON:  CT head without contrast 08/07/2011.  FINDINGS: Atrophy and white matter disease is similar to the prior exam. No acute cortical infarct, hemorrhage, or mass lesion is present. The ventricles are of normal size. No significant extra-axial fluid collection is present.  The paranasal sinuses and mastoid air cells are clear. The osseous skull is intact.  IMPRESSION: 1. No acute intracranial abnormality. 2. Stable atrophy and diffuse white matter disease. This likely reflects the sequela of chronic microvascular ischemia.   Electronically Signed   By: Gennette Pac M.D.   On: 07/06/2013 16:04   Dg  Chest Portable 1 View  07/09/2013   CLINICAL DATA:  Dementia.  Failure to thrive.  EXAM: PORTABLE CHEST - 1 VIEW  COMPARISON:  None.  FINDINGS: Cardiopericardial silhouette appears within normal limits. Basilar atelectasis. Monitoring leads project over the chest. Aortic arch atherosclerosis. No airspace disease. No pleural effusion.  IMPRESSION: No active disease.   Electronically Signed   By: Andreas Newport M.D.   On: 07/09/2013 14:59   Dg Abd Acute W/chest  07/06/2013   CLINICAL DATA:  Abdominal pain for 2-4 days, recently treated for urinary tract infection  EXAM: ACUTE ABDOMEN SERIES (ABDOMEN 2 VIEW & CHEST 1 VIEW)  COMPARISON:  CT abdomen pelvis- 05/02/2012; chest radiograph - 05/01/2012; 04/30/2012  FINDINGS: Grossly unchanged cardiac silhouette and mediastinal contours with atherosclerotic plaque within the thoracic aorta. The lungs appear hyperexpanded with flattening of the bilateral knee diaphragms a mild diffuse slightly nodular thickening of the pulmonary interstitium. There is persistent mild elevation/ eventration of the right hemidiaphragm. No focal airspace opacities. No pleural effusion or pneumothorax. No evidence of edema.  Nonobstructive bowel gas pattern. Unremarkable colonic stool burden. No pneumoperitoneum, pneumatosis or portal venous gas.  Several phleboliths overlie the left hemipelvis. Otherwise, no definite abnormal intra-abdominal calcifications.  Mild scoliotic curvature of the thoracolumbar spine with associated multilevel DDD. Post left femoral hemi arthroplasty, incompletely evaluated.  Regional soft tissues are normal.  IMPRESSION: 1. Hyperexpanded lungs without acute cardiopulmonary disease. 2. Nonobstructive bowel gas pattern. Unremarkable colonic stool  burden.   Electronically Signed   By: Simonne Come M.D.   On: 07/06/2013 15:19    Medications: Scheduled Meds: . Chlorhexidine Gluconate Cloth  6 each Topical Q0600  . hydrALAZINE  5 mg Intravenous Once  .  levofloxacin (LEVAQUIN) IV  750 mg Intravenous Q24H  . LORazepam  1 mg Intravenous Q6H  . mupirocin ointment  1 application Nasal BID  . polyvinyl alcohol  2 drop Both Eyes TID  . sodium chloride  3 mL Intravenous Q12H  . tobramycin   Both Eyes TID      LOS: 4 days   RAI,RIPUDEEP M.D. [facility] 07/13/2013, 3:14 PM Pager: [phone]  If 7PM-7AM, please contact night-coverage [email or web address] Password [email or web address]

## 2013-07-14 ENCOUNTER — Inpatient Hospital Stay (HOSPITAL_COMMUNITY)
Admission: AD | Admit: 2013-07-14 | Discharge: 2013-07-23 | DRG: 189 | Disposition: E | Source: Ambulatory Visit | Attending: Internal Medicine | Admitting: Internal Medicine

## 2013-07-14 DIAGNOSIS — A419 Sepsis, unspecified organism: Secondary | ICD-10-CM

## 2013-07-14 DIAGNOSIS — Z66 Do not resuscitate: Secondary | ICD-10-CM | POA: Diagnosis present

## 2013-07-14 DIAGNOSIS — F0390 Unspecified dementia without behavioral disturbance: Secondary | ICD-10-CM | POA: Diagnosis present

## 2013-07-14 DIAGNOSIS — Z515 Encounter for palliative care: Secondary | ICD-10-CM

## 2013-07-14 DIAGNOSIS — J9600 Acute respiratory failure, unspecified whether with hypoxia or hypercapnia: Principal | ICD-10-CM | POA: Diagnosis present

## 2013-07-14 DIAGNOSIS — IMO0002 Reserved for concepts with insufficient information to code with codable children: Secondary | ICD-10-CM

## 2013-07-14 DIAGNOSIS — K5900 Constipation, unspecified: Secondary | ICD-10-CM

## 2013-07-14 DIAGNOSIS — E039 Hypothyroidism, unspecified: Secondary | ICD-10-CM | POA: Diagnosis present

## 2013-07-14 DIAGNOSIS — R1310 Dysphagia, unspecified: Secondary | ICD-10-CM | POA: Diagnosis present

## 2013-07-14 DIAGNOSIS — R0609 Other forms of dyspnea: Secondary | ICD-10-CM

## 2013-07-14 DIAGNOSIS — K219 Gastro-esophageal reflux disease without esophagitis: Secondary | ICD-10-CM | POA: Diagnosis present

## 2013-07-14 LAB — EPSTEIN-BARR VIRUS VCA, IGG: EBV VCA IgG: >750 U/mL — ABNORMAL HIGH (ref <18.0)

## 2013-07-14 LAB — B. BURGDORFI ANTIBODIES: B burgdorferi Ab IgG+IgM: 0.58 {ISR}

## 2013-07-14 LAB — EPSTEIN-BARR VIRUS VCA, IGM: EBV VCA IgM: <10 U/mL (ref <36.0)

## 2013-07-14 LAB — CMV IGM: CMV IgM: <8 AU/mL (ref <30.00)

## 2013-07-14 MED ORDER — ACETAMINOPHEN 325 MG PO TABS: 650.0000 mg | ORAL_TABLET | Freq: Four times a day (QID) | ORAL | Status: AC | PRN

## 2013-07-14 MED ORDER — BISACODYL 10 MG RE SUPP: 10.0000 mg | Freq: Every day | RECTAL | Status: DC | PRN

## 2013-07-14 MED ORDER — MORPHINE SULFATE 2 MG/ML IJ SOLN
2.0000 mg | INTRAMUSCULAR | Status: AC
  Administered 2013-07-14: 2 mg via INTRAVENOUS

## 2013-07-14 MED ORDER — FUROSEMIDE 10 MG/ML IJ SOLN: 40.0000 mg | Freq: Once | INTRAMUSCULAR | Status: DC

## 2013-07-14 MED ORDER — MORPHINE SULFATE 2 MG/ML IJ SOLN: 1.0000 mg | INTRAMUSCULAR | Status: DC | PRN

## 2013-07-14 MED ORDER — ATROPINE SULFATE 1 % OP SOLN
2.0000 [drp] | OPHTHALMIC | Status: DC | PRN
  Filled 2013-07-14: qty 2

## 2013-07-14 MED ORDER — SCOPOLAMINE 1 MG/3DAYS TD PT72: 1.0000 | MEDICATED_PATCH | TRANSDERMAL | Status: DC

## 2013-07-14 MED ORDER — ALBUTEROL SULFATE (5 MG/ML) 0.5% IN NEBU: 2.5000 mg | INHALATION_SOLUTION | RESPIRATORY_TRACT | Status: DC | PRN

## 2013-07-14 MED ORDER — MORPHINE SULFATE 2 MG/ML IJ SOLN: 1.0000 mg | INTRAMUSCULAR | Status: AC | PRN

## 2013-07-14 MED ORDER — DEXTROSE 5 % IV SOLN: 1.0000 mg/h | INTRAVENOUS | Status: AC

## 2013-07-14 MED ORDER — DEXTROSE 5 % IV SOLN: 1.0000 mg/h | INTRAVENOUS | Status: DC

## 2013-07-14 MED ORDER — ACETAMINOPHEN 325 MG PO TABS: 650.0000 mg | ORAL_TABLET | Freq: Four times a day (QID) | ORAL | Status: DC | PRN

## 2013-07-14 MED ORDER — ONDANSETRON HCL 4 MG/2ML IJ SOLN: 4.0000 mg | Freq: Four times a day (QID) | INTRAMUSCULAR | Status: DC | PRN

## 2013-07-14 MED ORDER — LORAZEPAM 2 MG/ML IJ SOLN
2.0000 mg | INTRAMUSCULAR | Status: DC
  Administered 2013-07-14: 2 mg via INTRAVENOUS
  Filled 2013-07-14: qty 1

## 2013-07-14 MED ORDER — TOBRAMYCIN 0.3 % OP OINT
TOPICAL_OINTMENT | Freq: Three times a day (TID) | OPHTHALMIC | Status: DC
  Filled 2013-07-14: qty 3.5

## 2013-07-14 MED ORDER — MORPHINE SULFATE 10 MG/ML IJ SOLN
1.0000 mg/h | INTRAVENOUS | Status: DC
  Administered 2013-07-14: 1 mg/h via INTRAVENOUS
  Filled 2013-07-14: qty 10

## 2013-07-14 MED ORDER — LORAZEPAM 2 MG/ML IJ SOLN: 1.0000 mg | INTRAMUSCULAR | Status: DC | PRN

## 2013-07-14 MED ORDER — POLYVINYL ALCOHOL 1.4 % OP SOLN
2.0000 [drp] | Freq: Three times a day (TID) | OPHTHALMIC | Status: DC
  Filled 2013-07-14: qty 15

## 2013-07-14 MED ORDER — TOBRAMYCIN 0.3 % OP OINT: TOPICAL_OINTMENT | Freq: Three times a day (TID) | OPHTHALMIC | Status: AC

## 2013-07-14 MED ORDER — LORAZEPAM 2 MG/ML IJ SOLN: 2.0000 mg | INTRAMUSCULAR | Status: AC

## 2013-07-14 MED ORDER — LATANOPROST 0.005 % OP SOLN
1.0000 [drp] | Freq: Every day | OPHTHALMIC | Status: DC
  Filled 2013-07-14: qty 2.5

## 2013-07-14 MED ORDER — CHLORHEXIDINE GLUCONATE CLOTH 2 % EX PADS: 6.0000 | MEDICATED_PAD | Freq: Every day | CUTANEOUS | Status: AC

## 2013-07-14 MED ORDER — ACETAMINOPHEN 650 MG RE SUPP: 650.0000 mg | Freq: Four times a day (QID) | RECTAL | Status: DC | PRN

## 2013-07-14 MED ORDER — MORPHINE SULFATE 10 MG/ML IJ SOLN
1.0000 mg/h | INTRAVENOUS | Status: DC
  Administered 2013-07-14 (×2): 1 mg/h via INTRAVENOUS
  Filled 2013-07-14: qty 10

## 2013-07-14 MED ORDER — ONDANSETRON HCL 4 MG/2ML IJ SOLN: 4.0000 mg | Freq: Four times a day (QID) | INTRAMUSCULAR | Status: AC | PRN

## 2013-07-14 MED ORDER — ATROPINE SULFATE 1 % OP SOLN
2.0000 [drp] | OPHTHALMIC | Status: DC | PRN
  Administered 2013-07-14: 2 [drp] via SUBLINGUAL
  Filled 2013-07-14 (×2): qty 2

## 2013-07-14 MED ORDER — SODIUM CHLORIDE 0.9 % IV SOLN: INTRAVENOUS | Status: DC

## 2013-07-14 MED ORDER — MORPHINE SULFATE 10 MG/ML IJ SOLN
1.0000 mg/h | INTRAVENOUS | Status: DC
  Filled 2013-07-14: qty 10

## 2013-07-14 MED ORDER — MUPIROCIN 2 % EX OINT: 1.0000 "application " | TOPICAL_OINTMENT | Freq: Two times a day (BID) | CUTANEOUS | Status: AC

## 2013-07-14 MED ORDER — ATROPINE SULFATE 1 % OP SOLN: 2.0000 [drp] | OPHTHALMIC | Status: AC | PRN

## 2013-07-14 MED ORDER — ACETAMINOPHEN 650 MG RE SUPP: 650.0000 mg | Freq: Four times a day (QID) | RECTAL | Status: AC | PRN

## 2013-07-14 MED ORDER — LORAZEPAM 2 MG/ML IJ SOLN: 2.0000 mg | INTRAMUSCULAR | Status: DC

## 2013-07-14 MED ORDER — SCOPOLAMINE 1 MG/3DAYS TD PT72: 1.0000 | MEDICATED_PATCH | TRANSDERMAL | Status: AC

## 2013-07-14 MED ORDER — FUROSEMIDE 10 MG/ML IJ SOLN
INTRAMUSCULAR | Status: AC
  Filled 2013-07-14: qty 4

## 2013-07-15 LAB — CSF CULTURE W GRAM STAIN: Culture: NO GROWTH

## 2013-07-15 LAB — CSF CULTURE

## 2013-07-16 LAB — JC VIRUS, PCR CSF: JC Virus PCR, CSF: NOT DETECTED

## 2013-07-16 LAB — RSV(RESPIRATORY SYNCYTIAL VIRUS) AB, BLOOD

## 2013-07-19 LAB — CULTURE, BLOOD (ROUTINE X 2): Culture: NO GROWTH

## 2013-07-23 NOTE — Care Management Note (Signed)
07/19/13 1148 CM did place a call to Carley Hammed to see if pt will be eligible for GIP services. Carley Hammed stated she will call CM back after reviewing the pt. Gala Lewandowsky, RN,BSN [phone]

## 2013-07-23 NOTE — Discharge Summary (Signed)
Physician Discharge Summary  Patient ID: Kristina Browning MRN: [id number] DOB/AGE: 26-Sep-1925 80 y.o.  Admit date: 07/09/2013 Discharge date: 2013/08/10  Primary Care Physician:  Ruthe Mannan, MD  Discharge Diagnoses:   . opsoclonus/myoclonus of unclear etiology . Hypokalemia . Unspecified hypothyroidism . Memory loss with dementia  . Abnormal eye movements with myoclonic jerks  Consults:  Neurology, Dr. Thad Ranger                    Palliative medicine   Recommendations for Outpatient Follow-up:  Patient is complete comfort care with DNR/DNI  Allergies:   Allergies  Allergen Reactions  . Celexa [Citalopram Hydrobromide] Other (See Comments)    Per MAR  . Exelon [Rivastigmine] Other (See Comments)    Per MAR  . Sulfonamide Derivatives Hives    unsure  . Penicillins Rash    Per MAR     Discharge Medications:   Medication List    STOP taking these medications       acetaminophen 325 MG tablet  Commonly known as:  TYLENOL  Replaced by:  acetaminophen 650 MG suppository     cholecalciferol 1000 UNITS tablet  Commonly known as:  VITAMIN D     divalproex 125 MG DR tablet  Commonly known as:  DEPAKOTE     feeding supplement (PRO-STAT SUGAR FREE 64) Liqd     levothyroxine 88 MCG tablet  Commonly known as:  SYNTHROID, LEVOTHROID     LORazepam 0.5 MG tablet  Commonly known as:  ATIVAN  Replaced by:  LORazepam 2 MG/ML injection     omeprazole 20 MG capsule  Commonly known as:  PRILOSEC     ondansetron 4 MG tablet  Commonly known as:  ZOFRAN  Replaced by:  ondansetron 4 MG/2ML Soln injection     TUSSIN DM PO     UTI-STAT Liqd      TAKE these medications       acetaminophen 650 MG suppository  Commonly known as:  TYLENOL  Place 1 suppository (650 mg total) rectally every 6 (six) hours as needed for mild pain (or Fever >/= 101).     acetaminophen 325 MG tablet  Commonly known as:  TYLENOL  Take 2 tablets (650 mg total) by mouth every 6 (six) hours as  needed for mild pain (or Fever >/= 101).     atropine 1 % ophthalmic solution  Place 2 drops under the tongue every 2 (two) hours as needed (rattling).     Chlorhexidine Gluconate Cloth 2 % Pads  Apply 6 each topically daily at 6 (six) AM.     dextrose 5 % SOLN 100 mL with morphine 10 MG/ML SOLN 100 mg  Inject 1 mg/hr into the vein continuous.     latanoprost 0.005 % ophthalmic solution  Commonly known as:  XALATAN  Place 1 drop into both eyes at bedtime.     LORazepam 2 MG/ML injection  Commonly known as:  ATIVAN  Inject 1 mL (2 mg total) into the vein every 4 (four) hours.     morphine 2 MG/ML injection  Inject 0.5 mLs (1 mg total) into the vein every 2 (two) hours as needed (dyspnea).     mupirocin ointment 2 %  Commonly known as:  BACTROBAN  Place 1 application into the nose 2 (two) times daily.     ondansetron 4 MG/2ML Soln injection  Commonly known as:  ZOFRAN  Inject 2 mLs (4 mg total) into the vein every 6 (six) hours as  needed for nausea.     polyvinyl alcohol 1.4 % ophthalmic solution  Commonly known as:  LIQUIFILM TEARS  Place 2 drops into both eyes 3 (three) times daily.     scopolamine 1.5 MG  Commonly known as:  TRANSDERM-SCOP  Place 1 patch (1.5 mg total) onto the skin every 3 (three) days.     tobramycin 0.3 % ophthalmic ointment  Commonly known as:  TOBREX  Place into both eyes 3 (three) times daily.         Brief H and P: For complete details please refer to admission H and P, but in brief patient is 80 year old female with history of dementia, hypothyroidism, GERD who presented with worsening functional decline and abnormal eye movements. Patient's sons at the bedside had reported that over a period of one week, patient lost her mobility, unable to feed herself in facial expressions and speech pattern had changed markedly. They also reported abnormal eye movements. Patient was admitted for further workup.  Hospital Course:   Gait instability with  tremor and abnormal eye movements(opsoclonus/myoclonus)- patient is 80 years old female with history of dementia and had been on Depakote for mood stabilization which had been decreased over the last month prior to admission. Patient's family had noticed rapid decline in strength, not talking as much, abnormal eye movements for last 1 week prior to admission. Neurology was consulted and patient underwent full workup. So far workup was unremarkable normal B12, folate, ESR, TSH 5.2 with a normal free T4,T3 60.4, continued on Synthroid (dose had been recently increased outpatient). MRI of the brain was unremarkable, no acute infarct, extensive age-related cerebral atrophy.   EEG is nonspecific with general background slowing and difficult to differentiate continuous eye movement activity from possible epileptiform activity. Dr. Thad Ranger did lumbar puncture and studies have been negative so far, CMV negative, EBV IgG, RSV, JCV, B Burgdorfi still pending.  With recommendations from Dr. Thad Ranger, CT of the abdomen and pelvis to rule out any primary tumor paraneoplastic syndrome were ordered. However per patient's son's wishes that she is DNR and did not want to prolong suffering, she had wanted comfort care. Patient was made comfort care on 78/22/2014 after palliative medicine was consulted for goals of care.   On 07-16-13, Patient was noticed to be rattling and in respiratory distress today, placed on deep suction and morphine drip. Patient meets the criteria for hospice and hence accepted to [facility] hospice under comfort care goals.  Day of Discharge BP 122/74  Pulse 124  Temp(Src) 101 F (38.3 C) (Axillary)  Resp 31  Wt 65.227 kg (143 lb 12.8 oz)  SpO2 90%  Physical Exam:  General: Myoclonic jerks with respiratory distress  CVS: S1-S2 clear, no murmur rubs or gallops  Chest: Diffuse rhonchi bilaterally with audible rattling  Abdomen: soft NT, ND, NBS  Extremities: no c/c/e bilaterally   The  results of significant diagnostics from this hospitalization (including imaging, microbiology, ancillary and laboratory) are listed below for reference.    LAB RESULTS: Basic Metabolic Panel:  Recent Labs Lab 07/10/13 0847  07/12/13 0557 07/13/13 0530  NA  --   < > 134* 128*  K  --   < > 4.1 3.4*  CL  --   < > 101 99  CO2  --   < > 19 19  GLUCOSE  --   < > 113* 125*  BUN  --   < > 8 10  CREATININE  --   < > 0.90  0.81  CALCIUM  --   < > 8.9 8.2*  MG 1.7  --   --   --   < > = values in this interval not displayed. Liver Function Tests:  Recent Labs Lab 07/09/13 1347  AST 24  ALT 8  ALKPHOS 67  BILITOT 0.6  PROT 6.8  ALBUMIN 2.5*   No results found for this basename: LIPASE, AMYLASE,  in the last 168 hours No results found for this basename: AMMONIA,  in the last 168 hours CBC:  Recent Labs Lab 07/09/13 1347 07/10/13 0750  WBC 10.7* 8.2  HGB 14.0 13.1  HCT 40.2 37.3  MCV 97.3 96.4  PLT 214 211   Cardiac Enzymes: No results found for this basename: CKTOTAL, CKMB, CKMBINDEX, TROPONINI,  in the last 168 hours BNP: No components found with this basename: POCBNP,  CBG: No results found for this basename: GLUCAP,  in the last 168 hours  Significant Diagnostic Studies:  Ct Head Wo Contrast  07/09/2013   CLINICAL DATA:  Altered mental status  EXAM: CT HEAD WITHOUT CONTRAST  TECHNIQUE: Contiguous axial images were obtained from the base of the skull through the vertex without intravenous contrast. Study was obtained within 24 hr of patient's arrival the emergency department.  COMPARISON:  July 06, 2013  FINDINGS: Moderate diffuse atrophy is stable. There is no mass, hemorrhage, extra-axial fluid collection, or midline shift. Small vessel disease in the centra semiovale bilaterally is stable. There is no new gray-white compartment lesion. No demonstrable acute infarct. Bony calvarium appears intact. Mastoid air cells are clear.  IMPRESSION: Atrophy with small vessel  disease, stable. No intracranial mass, hemorrhage, or acute appearing infarct.   Electronically Signed   By: Bretta Bang M.D.   On: 07/09/2013 15:13   Mr Brain Wo Contrast  07/10/2013   EXAM: MRI HEAD WITHOUT CONTRAST  TECHNIQUE: Multiplanar, multiecho pulse sequences of the brain and surrounding structures were obtained without intravenous contrast.  COMPARISON:  Prior CT performed on 07/09/2013.  FINDINGS: Diffuse prominence of the CSF containing spaces is compatible with generalized age-related atrophy. A few scattered foci of T2/FLAIR hyperintensity seen within the periventricular and deep white matter is present, likely related to chronic microvascular ischemic disease, but very mild for patient age. No other focal parenchymal signal abnormality identified. There is no mass lesion or midline shift. Ventricles are normal in size without evidence of hydrocephalus. No extra-axial fluid collection.  No diffusion-weighted signal abnormality is identified to suggest acute intracranial infarct. Normal flow voids are seen within the intracranial vasculature. Gray-white matter differentiation is maintained. No intracranial hemorrhage seen on the gradient echo sequence.  Craniocervical junction is grossly normal. Pituitary gland is within normal limits. Globes and optic nerves are normal.  Calvarium demonstrates a normal appearance with normal signal intensity. Upper cervical spine within normal limits.  Mild mucosal thickening noted within both maxillary sinuses, right greater than left. Paranasal sinuses are otherwise largely clear. Scattered T2 hyperintensity noted within the mastoid air cells bilaterally.  IMPRESSION: 1. No acute intracranial infarct or other process identified. 2. Extensive age-related cerebral atrophy with mild chronic microvascular ischemic changes.   Electronically Signed   By: Rise Mu M.D.   On: 07/10/2013 23:27   Dg Chest Portable 1 View  07/09/2013   CLINICAL DATA:   Dementia.  Failure to thrive.  EXAM: PORTABLE CHEST - 1 VIEW  COMPARISON:  None.  FINDINGS: Cardiopericardial silhouette appears within normal limits. Basilar atelectasis. Monitoring leads project over the chest.  Aortic arch atherosclerosis. No airspace disease. No pleural effusion.  IMPRESSION: No active disease.   Electronically Signed   By: Andreas Newport M.D.   On: 07/09/2013 14:59       Disposition and Follow-up:    DISPOSITION: Hospice  DIET: Comfort foods   DISCHARGE FOLLOW-UP     Follow-up Information   Schedule an appointment as soon as possible for a visit with Ruthe Mannan, MD. (As needed if symptoms worsen)    Specialty:  Family Medicine   Contact information:   [address] [place] [address] [phone]       Time spent on Discharge: 35 minutes  Signed:   RAI,RIPUDEEP M.D. [facility] 08/06/2013, 2:29 PM Pager: [phone]

## 2013-07-23 NOTE — Progress Notes (Signed)
2013/08/04 1043 CM called [facility] liaison in regards for GIP for the patient.  CM did leave a voice mail at [phone]. Per MD pt is [facility] unstable [facility] at [facility]. CM did speak to son [name] and he is agreeable to [facility] providing services to pt if she is eligible for GIP. Dr. Isidoro Donning is aware of plan. CM will continue to monitor. Gala Lewandowsky, RN,BSN [phone]

## 2013-07-23 NOTE — Progress Notes (Signed)
Patient ID: Kristina Browning  female  WUJ:[id number]    DOB: Sep 07, 1925    DOA: 07/09/2013  PCP: Ruthe Mannan, MD  Assessment/Plan: Gait instability with tremor and abnormal eye movements(opsoclonus/myoclonus)- worsening, patient goes into myoclonic jerks with minimal touching - So far workup was unremarkable normal B12, folate, ESR, TSH 5.2 with a normal free T4,T3 60.4, continue Synthroid  - MRI of the brain unremarkable, no acute infarct, extensive age-related cerebral atrophy - EEG nonspecific with general background slowing and difficult to differentiate continuous eye movement activity from possible epileptiform activity - spinal tap done, studies negative so far, CMV negative, EBV IgG,  RSV, JCV, B Burgdorfi pending - Discussed with Dr. Thad Ranger, hed ordered CT of the abdomen and pelvis to rule out any primary tumor paraneoplastic syndrome. However per patient's son's wishes that she is DNR and did not want to prolong suffering, she had wanted comfort care. Patient was made comfort care yesterday after palliative medicine goals of care meeting - Patient was noticed to be rattling and in respiratory distress today, placed on deep suction, morphine drip, notified palliative medicine.   DVT Prophylaxis:  Code Status: DNR/DNI comfort care  Family Communication: Discussed in detail with patient's son, Mr Sharika Mosquera at bedside, patient's condition has deteriorated since yesterday.     Subjective: Unresponsive, still having jerking motions, rattling Objective: Weight change:  No intake or output data in the 24 hours ending Aug 03, 2013 1214 Blood pressure 122/74, pulse 124, temperature 101 F (38.3 C), temperature source Axillary, resp. rate 31, weight 65.227 kg (143 lb 12.8 oz), SpO2 90.00%.  Physical Exam: General:  Myoclonic jerks with respiratory distress CVS: S1-S2 clear, no murmur rubs or gallops Chest: Diffuse rhonchi bilaterally with audible rattling Abdomen: soft NT, ND, NBS   Extremities: no c/c/e bilaterally   Lab Results: Basic Metabolic Panel:  Recent Labs Lab 07/10/13 0847  07/12/13 0557 07/13/13 0530  NA  --   < > 134* 128*  K  --   < > 4.1 3.4*  CL  --   < > 101 99  CO2  --   < > 19 19  GLUCOSE  --   < > 113* 125*  BUN  --   < > 8 10  CREATININE  --   < > 0.90 0.81  CALCIUM  --   < > 8.9 8.2*  MG 1.7  --   --   --   < > = values in this interval not displayed. Liver Function Tests:  Recent Labs Lab 07/09/13 1347  AST 24  ALT 8  ALKPHOS 67  BILITOT 0.6  PROT 6.8  ALBUMIN 2.5*   No results found for this basename: LIPASE, AMYLASE,  in the last 168 hours No results found for this basename: AMMONIA,  in the last 168 hours CBC:  Recent Labs Lab 07/09/13 1347 07/10/13 0750  WBC 10.7* 8.2  HGB 14.0 13.1  HCT 40.2 37.3  MCV 97.3 96.4  PLT 214 211   Cardiac Enzymes: No results found for this basename: CKTOTAL, CKMB, CKMBINDEX, TROPONINI,  in the last 168 hours BNP: No components found with this basename: POCBNP,  CBG: No results found for this basename: GLUCAP,  in the last 168 hours   Micro Results: Recent Results (from the past 240 hour(s))  URINE CULTURE     Status: None   Collection Time    07/06/13 12:34 PM      Result Value Range Status   Specimen Description URINE, CATHETERIZED  Final   Special Requests NONE   Final   Culture  Setup Time     Final   Value: 07/06/2013 17:41     Performed at [facility]   Colony Count     Final   Value: NO GROWTH     Performed at [facility]   Culture     Final   Value: NO GROWTH     Performed at [facility]   Report Status 07/07/2013 FINAL   Final  URINE CULTURE     Status: None   Collection Time    07/09/13  2:20 PM      Result Value Range Status   Specimen Description URINE, CATHETERIZED   Final   Special Requests NONE   Final   Culture  Setup Time     Final   Value: 07/09/2013 20:49     Performed at [facility] Count      Final   Value: NO GROWTH     Performed at [facility]   Culture     Final   Value: NO GROWTH     Performed at [facility]   Report Status 07/10/2013 FINAL   Final  MRSA PCR SCREENING     Status: Abnormal   Collection Time    07/10/13  2:24 AM      Result Value Range Status   MRSA by PCR POSITIVE (*) NEGATIVE Final   Comment:            The GeneXpert MRSA Assay (FDA     approved for NASAL specimens     only), is one component of a     comprehensive MRSA colonization     surveillance program. It is not     intended to diagnose MRSA     infection nor to guide or     monitor treatment for     MRSA infections.     RESULT CALLED TO, READ BACK BY AND VERIFIED WITH:     A.DUNDON,RN 1610 07/10/13 M.CAMPBELL  GRAM STAIN     Status: None   Collection Time    07/12/13  8:47 AM      Result Value Range Status   Specimen Description CSF   Final   Special Requests NONE   Final   Gram Stain     Final   Value: CYTOSPIN SLIDE     WBC PRESENT, PREDOMINANTLY MONONUCLEAR     NO ORGANISMS SEEN   Report Status 07/12/2013 FINAL   Final  CSF CULTURE     Status: None   Collection Time    07/12/13  8:47 AM      Result Value Range Status   Specimen Description CSF   Final   Special Requests NONE   Final   Gram Stain     Final   Value: CYTOSPIN SLIDE WBC PRESENT, PREDOMINANTLY MONONUCLEAR     NO ORGANISMS SEEN     Performed at [facility]     Performed at [facility]   Culture     Final   Value: NO GROWTH 2 DAYS     Performed at [facility]   Report Status PENDING   Incomplete  CULTURE, BLOOD (ROUTINE X 2)     Status: None   Collection Time    07/12/13 10:00 PM      Result Value Range Status   Specimen Description BLOOD LEFT ARM  Final   Special Requests BOTTLES DRAWN AEROBIC AND ANAEROBIC 10CC EACH   Final   Culture  Setup Time     Final   Value: 07/13/2013 09:21     Performed at [facility]   Culture     Final   Value:        BLOOD  CULTURE RECEIVED NO GROWTH TO DATE CULTURE WILL BE HELD FOR 5 DAYS BEFORE ISSUING A FINAL NEGATIVE REPORT     Performed at [facility]   Report Status PENDING   Incomplete  CULTURE, BLOOD (ROUTINE X 2)     Status: None   Collection Time    07/12/13 10:30 PM      Result Value Range Status   Specimen Description BLOOD LEFT HAND   Final   Special Requests BOTTLES DRAWN AEROBIC AND ANAEROBIC 10CC EACH   Final   Culture  Setup Time     Final   Value: 07/13/2013 09:20     Performed at [facility]   Culture     Final   Value:        BLOOD CULTURE RECEIVED NO GROWTH TO DATE CULTURE WILL BE HELD FOR 5 DAYS BEFORE ISSUING A FINAL NEGATIVE REPORT     Performed at [facility]   Report Status PENDING   Incomplete    Studies/Results: Ct Head Wo Contrast  07/09/2013   CLINICAL DATA:  Altered mental status  EXAM: CT HEAD WITHOUT CONTRAST  TECHNIQUE: Contiguous axial images were obtained from the base of the skull through the vertex without intravenous contrast. Study was obtained within 24 hr of patient's arrival the emergency department.  COMPARISON:  July 06, 2013  FINDINGS: Moderate diffuse atrophy is stable. There is no mass, hemorrhage, extra-axial fluid collection, or midline shift. Small vessel disease in the centra semiovale bilaterally is stable. There is no new gray-white compartment lesion. No demonstrable acute infarct. Bony calvarium appears intact. Mastoid air cells are clear.  IMPRESSION: Atrophy with small vessel disease, stable. No intracranial mass, hemorrhage, or acute appearing infarct.   Electronically Signed   By: Bretta Bang M.D.   On: 07/09/2013 15:13   Ct Head Wo Contrast  07/06/2013   CLINICAL DATA:  Abdominal pain and weakness.  EXAM: CT HEAD WITHOUT CONTRAST  TECHNIQUE: Contiguous axial images were obtained from the base of the skull through the vertex without intravenous contrast.  COMPARISON:  CT head without contrast 08/07/2011.  FINDINGS:  Atrophy and white matter disease is similar to the prior exam. No acute cortical infarct, hemorrhage, or mass lesion is present. The ventricles are of normal size. No significant extra-axial fluid collection is present.  The paranasal sinuses and mastoid air cells are clear. The osseous skull is intact.  IMPRESSION: 1. No acute intracranial abnormality. 2. Stable atrophy and diffuse white matter disease. This likely reflects the sequela of chronic microvascular ischemia.   Electronically Signed   By: Gennette Pac M.D.   On: 07/06/2013 16:04   Dg Chest Portable 1 View  07/09/2013   CLINICAL DATA:  Dementia.  Failure to thrive.  EXAM: PORTABLE CHEST - 1 VIEW  COMPARISON:  None.  FINDINGS: Cardiopericardial silhouette appears within normal limits. Basilar atelectasis. Monitoring leads project over the chest. Aortic arch atherosclerosis. No airspace disease. No pleural effusion.  IMPRESSION: No active disease.   Electronically Signed   By: Andreas Newport M.D.   On: 07/09/2013 14:59   Dg Abd Acute W/chest  07/06/2013  CLINICAL DATA:  Abdominal pain for 2-4 days, recently treated for urinary tract infection  EXAM: ACUTE ABDOMEN SERIES (ABDOMEN 2 VIEW & CHEST 1 VIEW)  COMPARISON:  CT abdomen pelvis- 05/02/2012; chest radiograph - 05/01/2012; 04/30/2012  FINDINGS: Grossly unchanged cardiac silhouette and mediastinal contours with atherosclerotic plaque within the thoracic aorta. The lungs appear hyperexpanded with flattening of the bilateral knee diaphragms a mild diffuse slightly nodular thickening of the pulmonary interstitium. There is persistent mild elevation/ eventration of the right hemidiaphragm. No focal airspace opacities. No pleural effusion or pneumothorax. No evidence of edema.  Nonobstructive bowel gas pattern. Unremarkable colonic stool burden. No pneumoperitoneum, pneumatosis or portal venous gas.  Several phleboliths overlie the left hemipelvis. Otherwise, no definite abnormal intra-abdominal  calcifications.  Mild scoliotic curvature of the thoracolumbar spine with associated multilevel DDD. Post left femoral hemi arthroplasty, incompletely evaluated.  Regional soft tissues are normal.  IMPRESSION: 1. Hyperexpanded lungs without acute cardiopulmonary disease. 2. Nonobstructive bowel gas pattern. Unremarkable colonic stool burden.   Electronically Signed   By: Simonne Come M.D.   On: 07/06/2013 15:19    Medications: Scheduled Meds: . Chlorhexidine Gluconate Cloth  6 each Topical Q0600  . hydrALAZINE  5 mg Intravenous Once  . LORazepam  2 mg Intravenous Q4H  . mupirocin ointment  1 application Nasal BID  . polyvinyl alcohol  2 drop Both Eyes TID  . scopolamine  1 patch Transdermal Q72H  . sodium chloride  3 mL Intravenous Q12H  . tobramycin   Both Eyes TID      LOS: 5 days   Shakayla Hickox M.D. [facility] 11-Aug-2013, 12:14 PM Pager: [phone]  If 7PM-7AM, please contact night-coverage [email or web address] Password [email or web address]

## 2013-07-23 NOTE — Progress Notes (Signed)
Pt without heartbeat and respiration, auscultated by Emelda Brothers RN and Rhae Lerner RN. Pt son [name] notified. Dr. Phillips Odor notified. Emelda Brothers RN

## 2013-07-23 NOTE — Progress Notes (Signed)
[facility]) Social Work note: Received request from [employer] for family interest in admitting patient to hospice for end of life care in the hospital. Confirmed interest with two sons [name] and [place]. [name] is HCPOA. Both agreeable to hospice services and express desire for patient to remain on 3W which has been confirmed. Sons aware [facility] RNs will visit daily.   Both sons sad and having difficult time witnessing patient's approaching end of life. Both express desire for suffering to end. They have declined [facility] chaplain support. They are making final arrangements with [facility] in [place].   Please do not hesitate to contact me for support to this family. Thank you. Forrestine Him LCSW [phone]

## 2013-07-23 NOTE — Progress Notes (Signed)
Nutrition Brief Note  Chart reviewed due to low Braden score. Pt has transitioned to comfort care.  No nutrition interventions warranted at this time.  Please consult as needed.    Joaquin Courts, RD, LDN, CNSC Pager [phone] After Hours Pager [phone]

## 2013-07-23 NOTE — Care Management Note (Signed)
    Page 1 of 2   August 04, 2013     3:22:58 PM   CARE MANAGEMENT NOTE 08-04-2013  Patient:  Kristina Browning, Kristina Browning   Account Number:  [id number]  Date Initiated:  07/10/2013  Documentation initiated by:  GRAVES-BIGELOW,Shanice Poznanski  Subjective/Objective Assessment:   Pt admitted for worsening functional decline and abnl eye movements. PT was consulted and recommendations are for SNF.     Action/Plan:   CM will continue to monitor for disposition needs.   Anticipated DC Date:  07/13/2013   Anticipated DC Plan:  SKILLED NURSING FACILITY  In-house referral  Clinical Social Worker      DC Planning Services  CM consult      Choice offered to / List presented to:             Status of service:  Completed, signed off Medicare Important Message given?   (If response is "NO", the following Medicare IM given date fields will be blank) Date Medicare IM given:   Date Additional Medicare IM given:    Discharge Disposition:  HOSPICE MEDICAL FACILITY  Per UR Regulation:  Reviewed for med. necessity/level of care/duration of stay  If discussed at Long Length of Stay Meetings, dates discussed:   04-Aug-2013    Comments:  04-Aug-2013 [address], [place] [phone] CM did speak to MD Rai in reference to pt with d/c pt and having the attending changed to MD [facility]. Pt is accepted to [facility]. No further needs from CM at this time.  08-04-2013 1148 CM did place a call to Carley Hammed to see if pt will be eligible for GIP services. Carley Hammed stated she will call CM back after reviewing the pt. [place], [place] [phone]  08-04-13 1043 CM called [facility] liaison in regards for [facility]IP for the patient.  CM did leave a voice mail at [phone]. Per MD pt is to unstable for [facility] at this time. CM did speak to son [name] and he is agreeable to [facility] providing services to pt if she is eligible for GIP. Dr. Isidoro Donning is aware of plan. CM will continue to monitor. [place], [place] [phone]  07-13-13 [address], [place] [phone] Plan will be for [facility]. CM will f.u in am.

## 2013-07-23 NOTE — Progress Notes (Signed)
Inpatient Room [phone].  [facility] Admission Note  Pt admitted to [facility] services today under GIP status Hospice Dx: Dementia PPS-10% Code status: DNR Attending: Dr Phillips Odor  Pt transferred to the [facility] ER on 12/18 for AMS with abnormal eye movements: Opsoclonus-myoclonus syndrome per neuro.  Condition continued to decline.  07/13/13 PMT did a goals of care.  Family elected comfort care with DNR.  Pt lying in bed with head elevated.  Head turned slightly to the right and eyes were deviated to the right.  Eyes and lids had abnormal movements.  Breathing rapid and sustained while writer in the room at 52 breaths per minute.  Respirations loud and congested.  Skin hot to touch.  No PO intake.  Nailbeds starting to become cyanotic.  Pt appears to be actively dying.  Please contact [facility] at [phone] for any hospice concerns.  Elijah Birk RN, [facility] RN, [facility] [phone]

## 2013-07-23 NOTE — Progress Notes (Signed)
Progress Note from the Palliative Medicine Team at [facility]  Subjective:   Patient is restless and appears generally uncomfortable, audible throat secretions  -son outside the room, this is very difficult for him to witness the dying process, emotional support offered      Objective: Allergies  Allergen Reactions  . Celexa [Citalopram Hydrobromide] Other (See Comments)    Per MAR  . Exelon [Rivastigmine] Other (See Comments)    Per MAR  . Sulfonamide Derivatives Hives    unsure  . Penicillins Rash    Per MAR   Scheduled Meds: . Chlorhexidine Gluconate Cloth  6 each Topical Q0600  . hydrALAZINE  5 mg Intravenous Once  . LORazepam  2 mg Intravenous Q4H  .  morphine injection  2 mg Intravenous NOW  . mupirocin ointment  1 application Nasal BID  . polyvinyl alcohol  2 drop Both Eyes TID  . scopolamine  1 patch Transdermal Q72H  . sodium chloride  3 mL Intravenous Q12H  . tobramycin   Both Eyes TID   Continuous Infusions: . morphine     PRN Meds:.acetaminophen, acetaminophen, atropine, food thickener, LORazepam, morphine injection, ondansetron (ZOFRAN) IV, ondansetron  BP 122/74  Pulse 124  Temp(Src) 101 F (38.3 C) (Axillary)  Resp 31  Wt 65.227 kg (143 lb 12.8 oz)  SpO2 90%   PPS:20 %   No intake or output data in the 24 hours ending 08/10/2013 1021     Physical Exam:  General: Ill appearing, generally uncomfortable appearing  HEENT: L eye pink and irritated, dry buccal membranes, continuous rapid eye movements  Chest: Decreased in bases, scattered coarse BS, audible secretions CVS: RRR, no murmur noted  Abdomen:soft NT decreased BS  Ext: without edema  Neuro: unable to follow commands  Labs: CBC    Component Value Date/Time   WBC 8.2 07/10/2013 0750   RBC 3.87 07/10/2013 0750   HGB 13.1 07/10/2013 0750   HCT 37.3 07/10/2013 0750   PLT 211 07/10/2013 0750   MCV 96.4 07/10/2013 0750   MCH 33.9 07/10/2013 0750   MCHC 35.1 07/10/2013 0750   RDW 13.9  07/10/2013 0750   LYMPHSABS 2.5 07/06/2013 1220   MONOABS 1.3* 07/06/2013 1220   EOSABS 0.2 07/06/2013 1220   BASOSABS 0.1 07/06/2013 1220    BMET    Component Value Date/Time   NA 128* 07/13/2013 0530   K 3.4* 07/13/2013 0530   CL 99 07/13/2013 0530   CO2 19 07/13/2013 0530   GLUCOSE 125* 07/13/2013 0530   BUN 10 07/13/2013 0530   CREATININE 0.81 07/13/2013 0530   CALCIUM 8.2* 07/13/2013 0530   GFRNONAA 64* 07/13/2013 0530   GFRAA 75* 07/13/2013 0530    CMP     Component Value Date/Time   NA 128* 07/13/2013 0530   K 3.4* 07/13/2013 0530   CL 99 07/13/2013 0530   CO2 19 07/13/2013 0530   GLUCOSE 125* 07/13/2013 0530   BUN 10 07/13/2013 0530   CREATININE 0.81 07/13/2013 0530   CALCIUM 8.2* 07/13/2013 0530   PROT 6.8 07/09/2013 1347   ALBUMIN 2.5* 07/09/2013 1347   AST 24 07/09/2013 1347   ALT 8 07/09/2013 1347   ALKPHOS 67 07/09/2013 1347   BILITOT 0.6 07/09/2013 1347   GFRNONAA 64* 07/13/2013 0530   GFRAA 75* 07/13/2013 0530      Assessment and Plan: 1. Code Status:DNR/DNI-comfort is main focus of care 2. Symptom Control          Copious throat secretions:  frequent suction/ Scopolamine/atrponie gtt          Dyspnea: Morphine gtt titrate to comfort          Agitation: Ativan 4 mg every 4 hrs sceduled 3. Psycho/Social:  Emotional support to family, this is very difficult for them.  Symptoms have been difficult to get under control 4. Spiritual  Declined chaplain at this time 5. Disposition:  Expect hospital death, prognosis is likely hrs to days  Patient Documents Completed or Given: Document Given Completed  Advanced Directives Pkt    MOST    DNR    Gone from My Sight    Hard Choices yes     Time In Time Out Total Time Spent with Patient Total Overall Time  0830 0915 40 min 45 min    Greater than 50%  of this time was spent counseling and coordinating care related to the above assessment and plan.  Lorinda Creed NP  Palliative Medicine Team Team  Phone # [phone] Pager [phone]  Discussed with Dr Isidoro Donning 1

## 2013-07-23 DEATH — deceased

## 2013-07-25 NOTE — Consult Note (Signed)
I have reviewed and discussed the care of this patient in detail with the nurse practitioner including pertinent patient records, physical exam findings and data. I agree with details of this encounter.  

## 2013-08-23 NOTE — Discharge Summary (Signed)
Death Summary  Kristina Browning ZOX:[id number] DOB: [birth year] DOA: 2012/10/09  PCP: Ruthe Mannanalia Aron, MD PCP/Office notified: yes  Admit date: 2012/10/09 Date of Death: 08/12/2013  Final Diagnoses:  1. Respiratory Failure 2. Dementia 3. Terminal Dysphagia  History of present illness:  80 year old female with history of dementia, hypothyroidism, GERD who presented with worsening functional decline and abnormal eye movements. Patient's sons at the bedside had reported that over a period of one week, patient lost her mobility, was unable to feed herself, and had changes in her facial expressions and speech pattern. Neurological work-up largely unrevealing. Changes felt to be associated with progressive dementia. She subsequently developed respiratory failure.  Hospital Course:  Kristina Brow[facility] at 16:37 and expired at 17:55. See prior discharge summary for further details. This palliative physician was unable to examine or evaluate patient prior to her death. RN pronounced. Death certificate was completed.  Time: 20 minutes in documentation including completion of death certificate.  SignedAnderson Malta:  Berneta Sconyers  Palliative Medicine Team 08/12/2013, 10:58 PM

## 2015-05-31 IMAGING — CT CT HEAD W/O CM
1 series · 16 of 30 positions shown, 20 images · non-contrast
Comparison: July 06, 2013

CLINICAL DATA: Altered mental status

EXAM:
CT HEAD WITHOUT CONTRAST
TECHNIQUE: Contiguous axial images were obtained from the base of the skull
through the vertex without intravenous contrast. Study was obtained
within 24 hr of patient's arrival the emergency department.

[Series 2: head 5.0 h30s · axial · 0.40mm/px · z∈[+85,+220]mm · 16 of 30 slices shown, 20 images]
[im 2/30  brain]
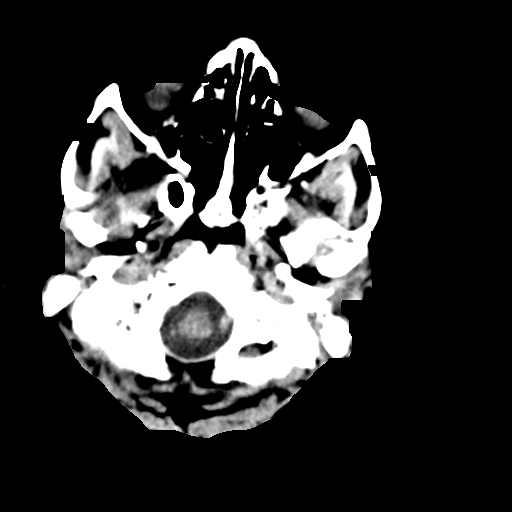
[im 2/30  bone]
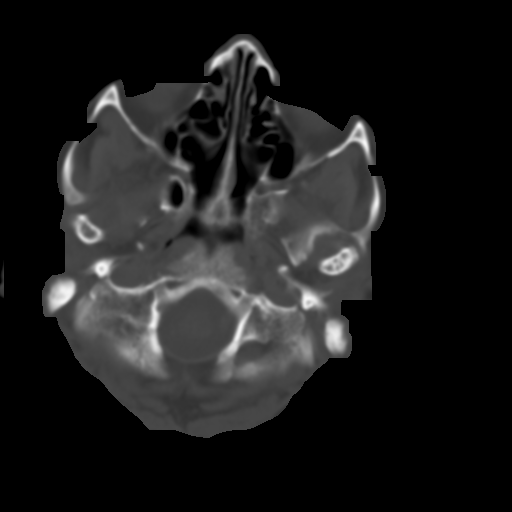
[im 4/30  brain]
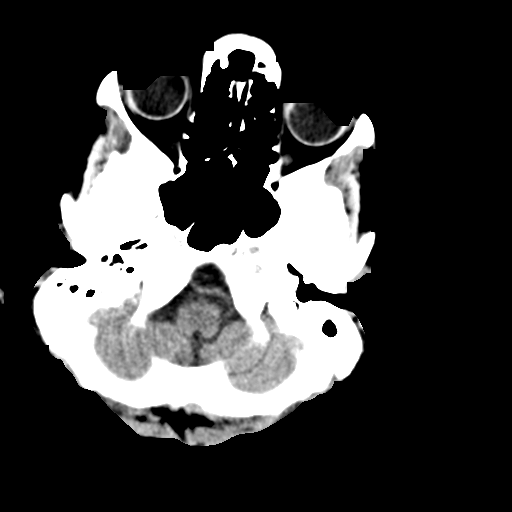
[im 6/30  brain]
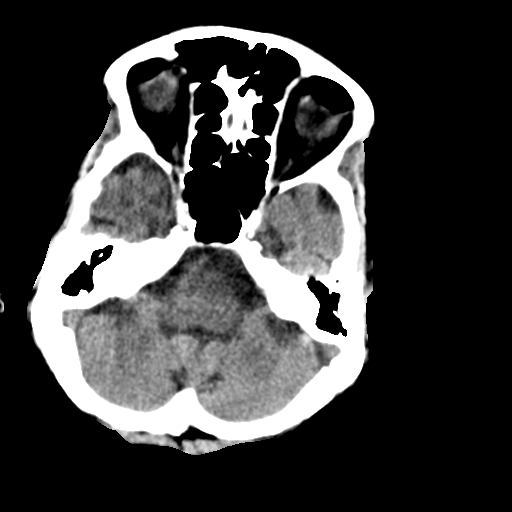
[im 8/30  brain]
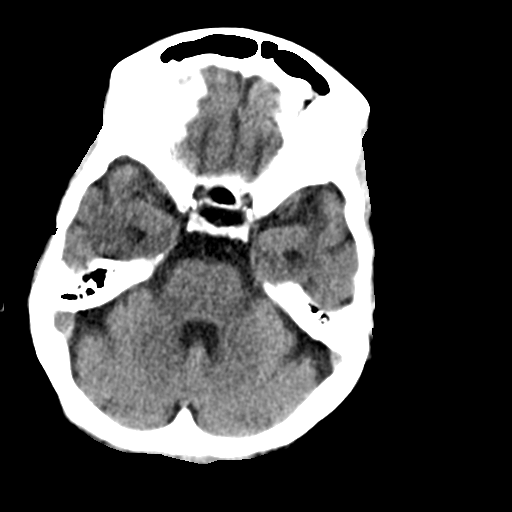
[im 9/30  brain]
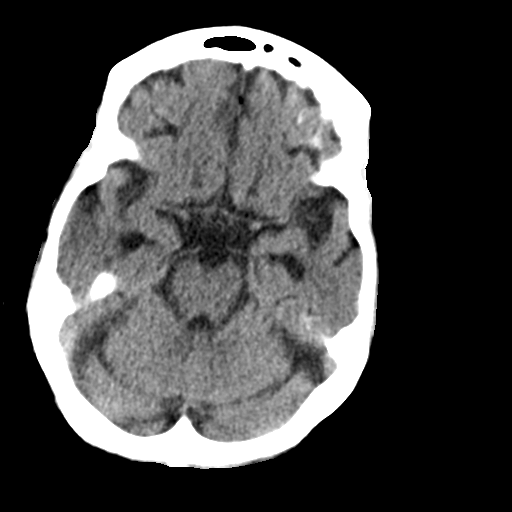
[im 9/30  bone]
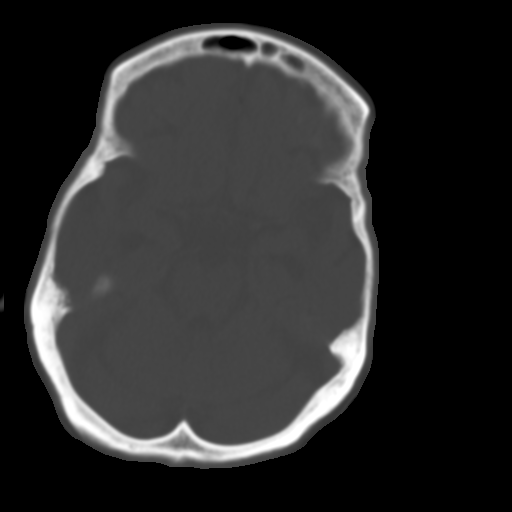
[im 11/30  brain]
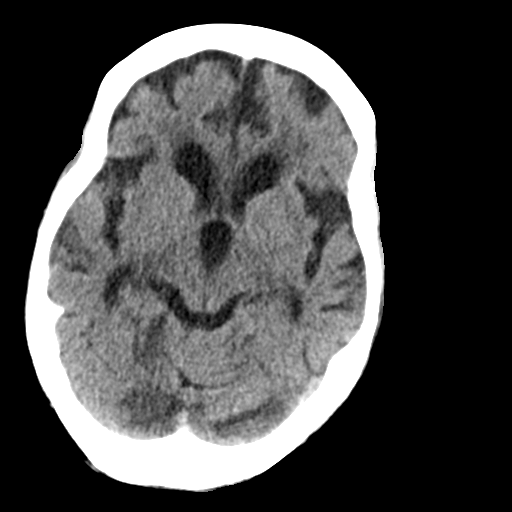
[im 13/30  brain]
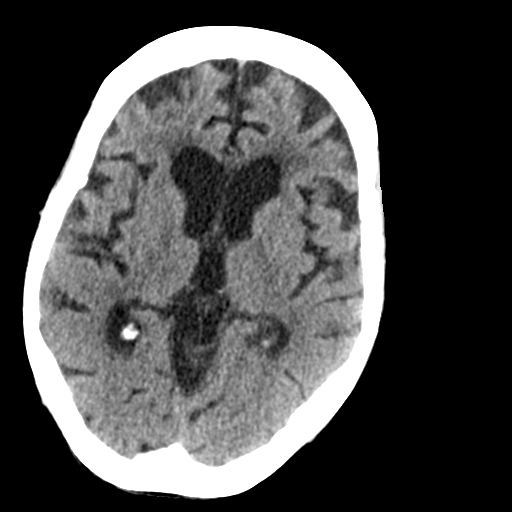
[im 15/30  brain]
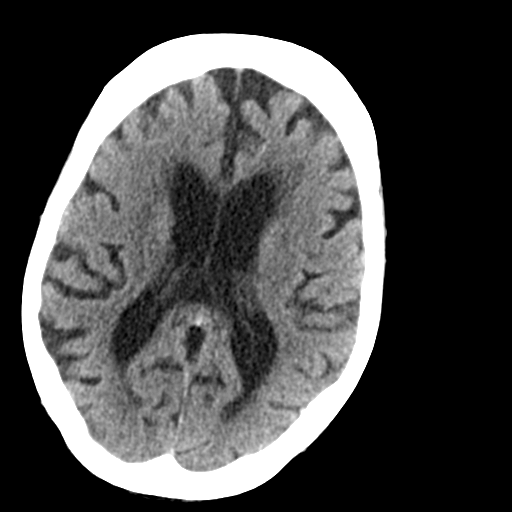
[im 16/30  brain]
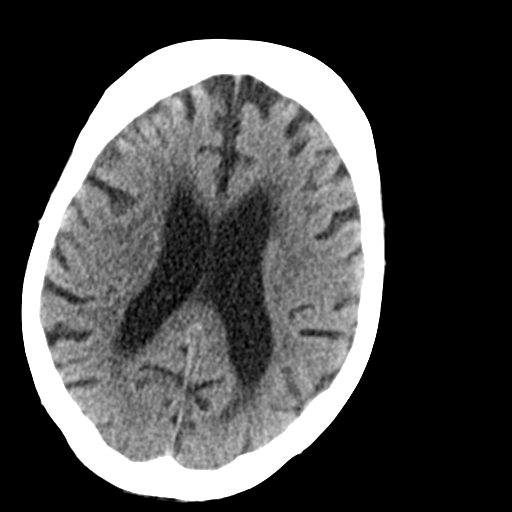
[im 16/30  bone]
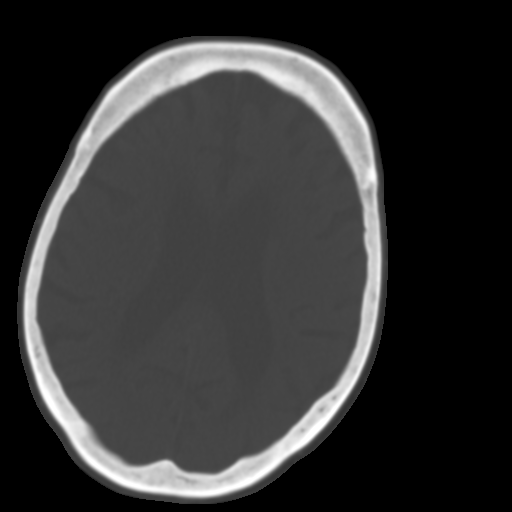
[im 18/30  brain]
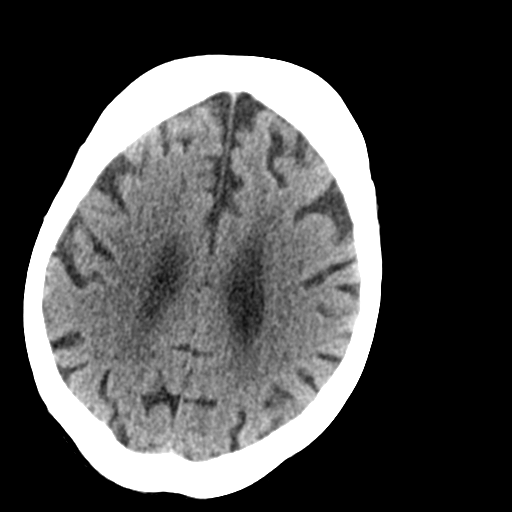
[im 20/30  brain]
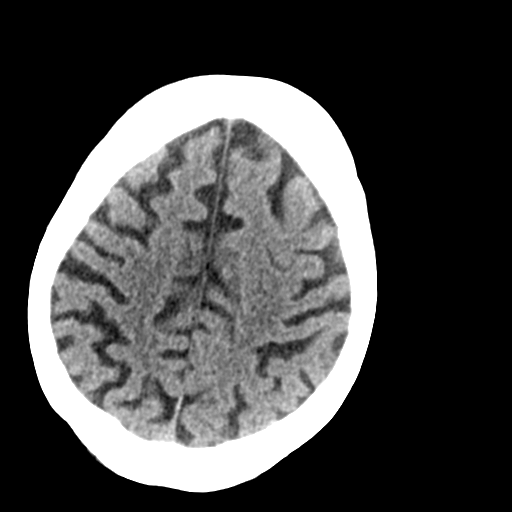
[im 22/30  brain]
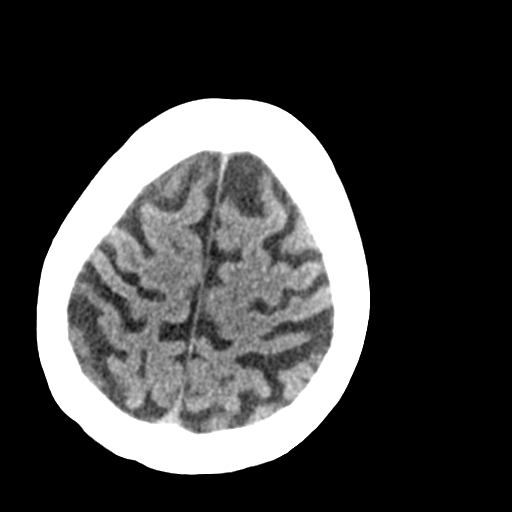
[im 23/30  brain]
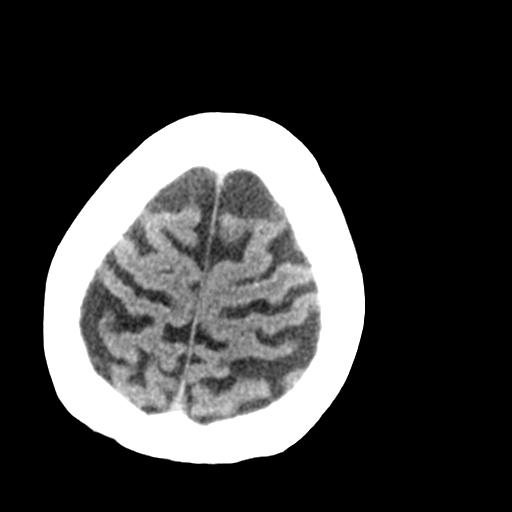
[im 23/30  bone]
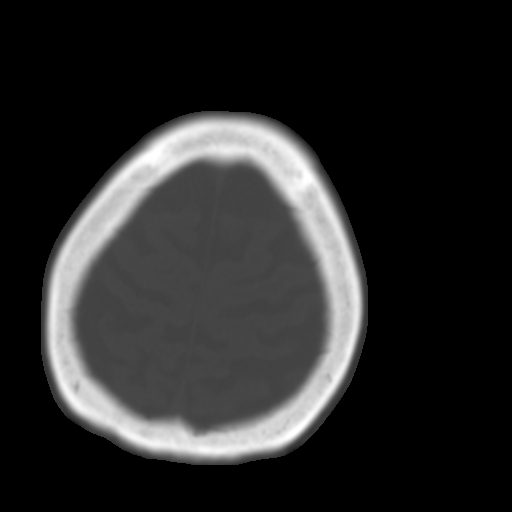
[im 25/30  brain]
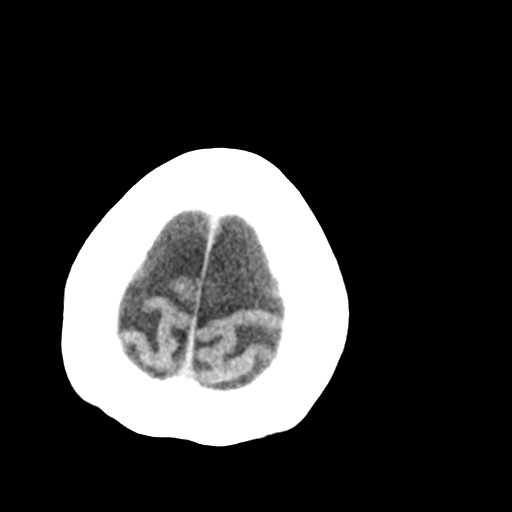
[im 27/30  brain]
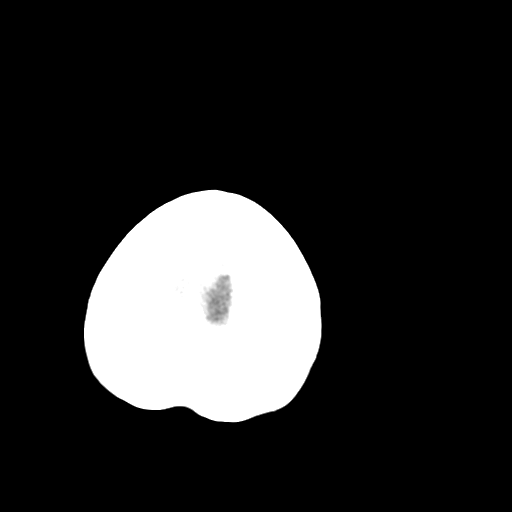
[im 29/30  brain]
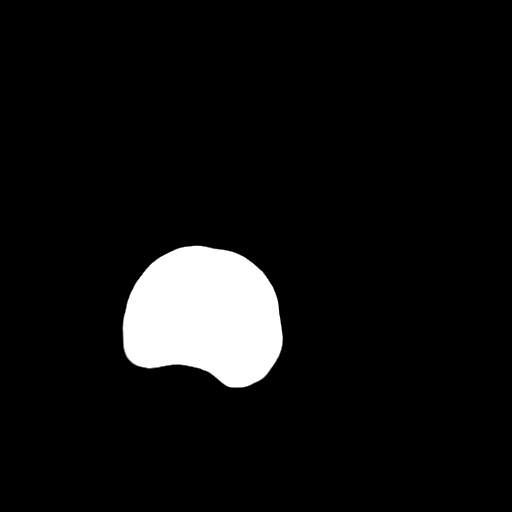

[16 of 30 positions shown; findings below may reference images not displayed]

FINDINGS: Moderate diffuse atrophy is stable. There is no mass, hemorrhage,
extra-axial fluid collection, or midline shift. Small vessel disease
in the centra semiovale bilaterally is stable. There is no new
gray-white compartment lesion. No demonstrable acute infarct. Bony
calvarium appears intact. Mastoid air cells are clear.
IMPRESSION: Atrophy with small vessel disease, stable. No intracranial mass,
hemorrhage, or acute appearing infarct.
